# Patient Record
Sex: Female | Born: 1990 | Race: Black or African American | Hispanic: No | Marital: Single | Smoking: Never smoker
Health system: Southern US, Community
[De-identification: ages and names within clinical notes are randomized; demographics above are authoritative.]

## PROBLEM LIST (undated history)

## (undated) ENCOUNTER — Ambulatory Visit: Payer: Self-pay

## (undated) DIAGNOSIS — J36 Peritonsillar abscess: Secondary | ICD-10-CM

---
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
## 2011-01-16 ENCOUNTER — Encounter: Payer: Self-pay | Admitting: Physical Medicine and Rehabilitation

## 2011-01-16 ENCOUNTER — Emergency Department (HOSPITAL_COMMUNITY)
Admission: EM | Admit: 2011-01-16 | Discharge: 2011-01-16 | Disposition: A | Discharge status: Home / Self Care | Payer: 59 | Attending: Emergency Medicine | Admitting: Emergency Medicine

## 2011-01-16 DIAGNOSIS — J020 Streptococcal pharyngitis: Secondary | ICD-10-CM | POA: Insufficient documentation

## 2011-01-16 MED ORDER — PREDNISONE 20 MG PO TABS
10.0000 mg | ORAL_TABLET | Freq: Once | ORAL | Status: AC
  Administered 2011-01-16: 10 mg via ORAL
  Filled 2011-01-16: qty 1

## 2011-01-16 MED ORDER — GI COCKTAIL ~~LOC~~
30.0000 mL | Freq: Once | ORAL | Status: AC
  Administered 2011-01-16: 30 mL via ORAL
  Filled 2011-01-16: qty 30

## 2011-01-16 MED ORDER — PENICILLIN V POTASSIUM 500 MG PO TABS: 500.0000 mg | ORAL_TABLET | Freq: Three times a day (TID) | ORAL | Status: AC

## 2011-01-16 NOTE — ED Provider Notes (Signed)
History     CSN: [id number] Arrival date & time: 01/16/2011  7:54 PM   First MD Initiated Contact with Patient 01/16/11 2317      No chief complaint on file.   HPI  History provided by the patient. Patient presents with complaints of increasing sore throat for the past one to 2 days. Pain symptoms are similar to previous strep throat infection. Patient states she was treated for strep throat one month ago by [facility]. She took antibiotics for 10 days and had improvement of symptoms and felt well the rest of the month. sHe does not know of any sick contacts. Patient denies cough, fever, chills, sweats, difficulty swallowing, difficulty breathing, nausea or vomiting. Patient has no other significant past medical history.   History reviewed. No pertinent past medical history.  History reviewed. No pertinent past surgical history.  History reviewed. No pertinent family history.  History  Substance Use Topics  . Smoking status: Never Smoker   . Smokeless tobacco: Not on file  . Alcohol Use: No    OB History    Grav Para Term Preterm Abortions TAB SAB Ect Mult Living                  Review of Systems  Constitutional: Negative for fever and diaphoresis.  HENT: Positive for sore throat. Negative for congestion and rhinorrhea.   Respiratory: Negative for cough and shortness of breath.   Cardiovascular: Negative for chest pain.  All other systems reviewed and are negative.    Allergies  Review of patient's allergies indicates no known allergies.  Home Medications  No current outpatient prescriptions on file.  BP 114/75  Pulse 104  Temp(Src) 98.7 F (37.1 C) (Oral)  Resp 18  SpO2 98%  Physical Exam  Nursing note and vitals reviewed. Constitutional: She is oriented to person, place, and time. She appears well-developed and well-nourished. No distress.  HENT:  Head: Normocephalic and atraumatic.  Right Ear: External ear normal.  Left  Ear: External ear normal.       Tonsils enlarged with erythema bilaterally and Prelone exudate. Uvula midline. No signs for PTA.  Neck: Normal range of motion. Neck supple. No tracheal deviation present.  Cardiovascular: Normal rate and normal heart sounds.   Pulmonary/Chest: Effort normal and breath sounds normal. She has no wheezes. She has no rales.  Abdominal: Soft.  Lymphadenopathy:    She has cervical adenopathy.  Neurological: She is alert and oriented to person, place, and time. No cranial nerve deficit.  Skin: Skin is warm. No rash noted.  Psychiatric: She has a normal mood and affect. Her behavior is normal.    ED Course  Procedures (including critical care time)  Labs Reviewed  RAPID STREP SCREEN - Abnormal; Notable for the following:    Streptococcus, Group A Screen (Direct) POSITIVE (*)    All other components within normal limits   No results found.   1. Strep throat       MDM  11:20 PM patient seen and evaluated. Patient in no acute distress.        Angus Seller, [place] 01/17/11 [phone]

## 2011-01-16 NOTE — ED Notes (Signed)
Pt reports throat pain and swelling x 1 day.  No distress noted.  Pt speaking in complete sentences, controlling salivia.  Skin warm, dry and intact.  Neuro intact.  Updated on POC.

## 2011-01-16 NOTE — ED Notes (Signed)
Pt presents to department for evaluation of throat pain. Ongoing x1 day. No relief from warm salt water rinse. Recently had strep throat x1 month ago. No fever. Pt alert and oriented x4. No signs of distress at the present.

## 2011-01-16 NOTE — ED Notes (Signed)
Pt updated on delay.  Pt reports increase in difficulty breathing.  Pt ambulatory in dept without difficulty-talking in complete sentences.  pts sats 99% on RA.

## 2011-01-18 NOTE — ED Provider Notes (Signed)
Medical screening examination/treatment/procedure(s) were performed by non-physician practitioner and as supervising physician I was immediately available for consultation/collaboration.   Lebaron Bautch, MD 01/18/11 0726 

## 2011-05-05 ENCOUNTER — Encounter (HOSPITAL_COMMUNITY): Payer: Self-pay | Admitting: *Deleted

## 2011-05-05 ENCOUNTER — Emergency Department (HOSPITAL_COMMUNITY): Payer: 59

## 2011-05-05 ENCOUNTER — Emergency Department (HOSPITAL_COMMUNITY)
Admission: EM | Admit: 2011-05-05 | Discharge: 2011-05-06 | Disposition: A | Discharge status: Home / Self Care | Payer: 59 | Attending: Emergency Medicine | Admitting: Emergency Medicine

## 2011-05-05 DIAGNOSIS — R220 Localized swelling, mass and lump, head: Secondary | ICD-10-CM | POA: Insufficient documentation

## 2011-05-05 DIAGNOSIS — J36 Peritonsillar abscess: Secondary | ICD-10-CM | POA: Insufficient documentation

## 2011-05-05 DIAGNOSIS — M542 Cervicalgia: Secondary | ICD-10-CM | POA: Insufficient documentation

## 2011-05-05 MED ORDER — OXYCODONE-ACETAMINOPHEN 5-325 MG PO TABS: 1.0000 | ORAL_TABLET | Freq: Four times a day (QID) | ORAL | Status: DC | PRN

## 2011-05-05 MED ORDER — IOHEXOL 300 MG/ML  SOLN
80.0000 mL | Freq: Once | INTRAMUSCULAR | Status: AC | PRN
  Administered 2011-05-05: 80 mL via INTRAVENOUS

## 2011-05-05 MED ORDER — OXYCODONE-ACETAMINOPHEN 5-325 MG PO TABS
1.0000 | ORAL_TABLET | Freq: Once | ORAL | Status: AC
  Administered 2011-05-05: 1 via ORAL
  Filled 2011-05-05: qty 1

## 2011-05-05 MED ORDER — CLINDAMYCIN HCL 300 MG PO CAPS: 300.0000 mg | ORAL_CAPSULE | Freq: Four times a day (QID) | ORAL | Status: DC

## 2011-05-05 MED ORDER — DEXAMETHASONE SODIUM PHOSPHATE 10 MG/ML IJ SOLN
8.0000 mg | Freq: Once | INTRAMUSCULAR | Status: AC
  Administered 2011-05-05: 8 mg via INTRAVENOUS
  Filled 2011-05-05: qty 1

## 2011-05-05 NOTE — Discharge Instructions (Signed)
If you were found to have a peritonsillar abscess. It is very important that you have close followup with ear nose and throat specialist in the next 24-48 hours. Please call Dr. Thurmon Fair office tomorrow to schedule your appointment. If you have any increased swelling, difficulty swallowing or breathing, fever, chills, sweats return to emergency room sooner.   Peritonsillar Abscess A peritonsillar abscess is a collection of pus located in the back of the throat behind the tonsils. It usually occurs when a streptococcal infection of the throat or tonsils spreads into the space around the tonsils. They are almost always caused by the streptococcal germ (bacteria). The treatment of a peritonsillar abscess is most often drainage accomplished by putting a needle into the abscess or cutting (incising) and draining the abscess. This is most often followed with a course of antibiotics. HOME CARE INSTRUCTIONS  If your abscess was drained by your caregiver today, rinse your throat (gargle) with warm salt water four times per day or as needed for comfort. Do not swallow this mixture. Mix 1 teaspoon of salt in 8 ounces of warm water for gargling.   Rest in bed as needed. Resume activities as able.   Apply cold to your neck for pain relief. Fill a plastic bag with ice and wrap it in a towel. Hold the ice on your neck for 20 minutes 4 times per day.   Eat a soft or liquid diet as tolerated while your throat remains sore. Popsicles and ice cream may be good early choices. Drinking plenty of cold fluids will probably be soothing and help take swelling down in between the warm gargles.   Only take over-the-counter or prescription medicines for pain, discomfort, or fever as directed by your caregiver. Do not use aspirin unless directed by your physician. Aspirin slows down the clotting process. It can also cause bleeding from the drainage area if this was needled or incised today.   If antibiotics were prescribed,  take them as directed for the full course of the prescription. Even if you feel you are well, you need to take them.  SEEK MEDICAL CARE IF:   You have increased pain, swelling, redness, or drainage in your throat.   You develop signs of infection such as dizziness, headache, lethargy, or generalized feelings of illness.   You have difficulty breathing, swallowing or eating.   You show signs of becoming dehydrated (lightheadedness when standing, decreased urine output, a fast heart rate, or dry mouth and mucous membranes).  SEEK IMMEDIATE MEDICAL CARE IF:   You have a fever.   You are coughing up or vomiting blood.   You develop more severe throat pain uncontrolled with medicines or you start to drool.   You develop difficulty breathing, talking, or find it easier to breathe while leaning forward.  Document Released: 01/26/2005 Document Revised: 01/15/2011 Document Reviewed: 09/09/2007 [facility] Patient Information 2012 [place], [place].   RESOURCE GUIDE  Dental Problems  Patients with Medicaid: [facility] [id number]                                           [phone] [address] Phone:  [phone]  Phone:  [phone]  If unable to pay or uninsured, contact:  [facility] or [facility]. to become qualified for the adult dental clinic.  Chronic Pain Problems Contact [facility]  [phone] Patients need to be referred by their primary care doctor.  Insufficient Money for Medicine Contact [employer]:  call "211" or [facility] [phone].  No Primary Care Doctor Call [facility]  [phone] Other agencies that provide inexpensive medical care    [facility]  [phone]    [facility]  [phone]    [facility]  [phone]    [facility]  [phone]    [facility]  [phone]     [facility]  [phone]  Psychological Services [facility]  [phone] [facility] Services  [phone] [facility]   [phone] (emergency services [phone])  Substance Abuse Resources [facility]  3190355586 [facility] [phone] [facility] 623-180-4072 Floydene Flock [phone] Residential & Outpatient Substance Abuse Program  2288520213  Abuse/Neglect [facility] Child Abuse Hotline [phone] [facility] Child Abuse Hotline 214-714-7208 (After Hours)  Emergency Shelter [facility] [phone]  Maternity Homes [facility] at the [place] [facility] 802-867-1814 [facility] 5063187578  MRSA Hotline #:   3374917582    [facility]  [facility] [place]     [employer]                          [facility] Dept. [address] [place]                       [address]      [address] [place] [address]  Blondell Reveal Phone:  [phone]                                   Phone:  [phone]                 Phone:  [phone]  [facility] Phone:  [phone]  [facility] Child Abuse Hotline [phone] [phone] (After Hours)

## 2011-05-05 NOTE — ED Provider Notes (Signed)
History     CSN: [id number]  Arrival date & time 05/05/11  1830   First MD Initiated Contact with Patient 05/05/11 2038      Chief Complaint  Patient presents with  . Sore Throat     HPI  History provided by the patient. Patient is a 21 year old female with history of left PTA last December who presents with complaints of 2 days of left throat and neck pains. Patient states symptoms are similar to her previous PTA. Patient reports having an I&D performed in [place] by ENT specialist. She was prescribed kanamycin in December but only took 2 days of this medicine. Her symptoms returned 2 days ago with pain and soreness to left throat. Patient took one dose of clindamycin today and one oxycodone that she had left from previous prescriptions. Pain is described as a severe soreness there is increased with movements of swallowing. Patient denies any other aggravating or alleviating factors. She denies any associated fever, chills, sweats, nausea or vomiting.      History reviewed. No pertinent past medical history.  History reviewed. No pertinent past surgical history.  No family history on file.  History  Substance Use Topics  . Smoking status: Never Smoker   . Smokeless tobacco: Not on file  . Alcohol Use: No    OB History    Grav Para Term Preterm Abortions TAB SAB Ect Mult Living                  Review of Systems  Constitutional: Negative for fever, chills and appetite change.  HENT: Positive for sore throat. Negative for congestion and rhinorrhea.   Respiratory: Negative for cough.   Gastrointestinal: Negative for nausea and vomiting.  Skin: Negative for rash.    Allergies  Review of patient's allergies indicates no known allergies.  Home Medications   Current Outpatient Rx  Name Route Sig Dispense Refill  . CLINDAMYCIN HCL 300 MG PO CAPS Oral Take 300 mg by mouth 4 (four) times daily.    . OXYCODONE-ACETAMINOPHEN 5-325 MG PO TABS Oral Take 1 tablet by mouth  every 4 (four) hours as needed. For pain.      BP 128/72  Pulse 92  Temp(Src) 99.1 F (37.3 C) (Oral)  Resp 16  Ht 5\' 3"  (1.6 m)  Wt 136 lb (61.689 kg)  BMI 24.09 kg/m2  SpO2 99%  Physical Exam  Nursing note and vitals reviewed. Constitutional: She is oriented to person, place, and time. She appears well-developed and well-nourished. No distress.  HENT:  Head: Normocephalic.       Swelling of left tonsil with slight prominence of anterior pillar.  Uvula midline.  Appearance concerning for early PTA. No trismus  Neck: Normal range of motion. Neck supple.  Cardiovascular: Normal rate and regular rhythm.   Pulmonary/Chest: Effort normal and breath sounds normal. No respiratory distress. She has no wheezes.  Lymphadenopathy:    She has no cervical adenopathy.  Neurological: She is alert and oriented to person, place, and time.  Skin: Skin is warm and dry. No rash noted.  Psychiatric: She has a normal mood and affect. Her behavior is normal.    ED Course  Procedures     Ct Soft Tissue Neck W Contrast  05/05/2011  *RADIOLOGY REPORT*  Clinical Data: Left-sided neck pain and swelling; fever.  Question of abscess.  CT NECK WITH CONTRAST  Technique:  Multidetector CT imaging of the neck was performed with intravenous contrast.  Contrast:  80 mL  of Omnipaque 300 IV contrast  Comparison: None.  Findings: There is a focal 1.7 x 1.6 x 0.9 cm abscess noted within the periphery of the left palatine tonsil, with surrounding soft tissue inflammation involving the left parapharyngeal fat planes. There is associated enlargement of the left palatine tonsil, with partial effacement of the oropharynx and nasopharynx.  Associated soft tissue edema is noted extending inferiorly, medial to the left angle of the mandible, without definite evidence of involvement of the floor of the mouth at this time.  No additional abscesses are identified.  The adenoids are grossly unremarkable in appearance.  The  hypopharynx is unremarkable; the valleculae and piriform sinuses are within normal limits. Prevertebral soft tissues are normal in thickness.  The proximal trachea is unremarkable in appearance.  There is no evidence of vascular compromise.  Scattered cervical nodes remain normal in size; there is no evidence of cervical lymphadenopathy.  The parotid and submandibular glands are unremarkable in appearance.  The thyroid gland is within normal limits.  The visualized lung apices are clear.  The visualized superior mediastinum is grossly unremarkable in appearance.  No acute osseous abnormalities are seen.  The orbits are within normal limits.  The paranasal sinuses and mastoid air cells are well-aerated.  The visualized portions of the brain are normal in appearance.  IMPRESSION: Focal 1.7 x 1.6 x 0.9 cm abscess at the periphery of the left palatine tonsil.  Surrounding soft tissue inflammation involves the left parapharyngeal fat planes; soft tissue edema extends inferiorly, medial to the left angle of the mandible, without definite involvement of the floor of the mouth at this time. Associated partial effacement of the oropharynx and nasopharynx.  Original Report Authenticated By: Tonia Ghent, M.D.       1. Peritonsillar abscess       MDM  8:30PM patient seen and evaluated. Patient no acute distress.   9:00 PM patient discussed and seen by attending physician. Attending spoke with ENT on call. They will see patient in office for further evaluation.     Angus Seller, [place] 05/07/11 [phone]

## 2011-05-05 NOTE — ED Notes (Addendum)
Patient was seen in December for swelling in her tonsils.  She was given antibiotics.  Patient was then seen at her home town hospital and tx for peritonsilar abcess.  Patient states the area has returned on her left side.  She reports fever and chills.  Patient states the left side of her face/neck feels swollen

## 2011-05-07 ENCOUNTER — Encounter (HOSPITAL_COMMUNITY): Payer: Self-pay | Admitting: Emergency Medicine

## 2011-05-07 ENCOUNTER — Emergency Department (HOSPITAL_COMMUNITY)
Admission: EM | Admit: 2011-05-07 | Discharge: 2011-05-08 | Disposition: A | Discharge status: Home / Self Care | Payer: 59 | Attending: Emergency Medicine | Admitting: Emergency Medicine

## 2011-05-07 DIAGNOSIS — L270 Generalized skin eruption due to drugs and medicaments taken internally: Secondary | ICD-10-CM | POA: Insufficient documentation

## 2011-05-07 DIAGNOSIS — L298 Other pruritus: Secondary | ICD-10-CM | POA: Insufficient documentation

## 2011-05-07 DIAGNOSIS — T368X5A Adverse effect of other systemic antibiotics, initial encounter: Secondary | ICD-10-CM | POA: Insufficient documentation

## 2011-05-07 DIAGNOSIS — L2989 Other pruritus: Secondary | ICD-10-CM | POA: Insufficient documentation

## 2011-05-07 DIAGNOSIS — R070 Pain in throat: Secondary | ICD-10-CM | POA: Insufficient documentation

## 2011-05-07 DIAGNOSIS — J351 Hypertrophy of tonsils: Secondary | ICD-10-CM | POA: Insufficient documentation

## 2011-05-07 DIAGNOSIS — J36 Peritonsillar abscess: Secondary | ICD-10-CM | POA: Insufficient documentation

## 2011-05-07 DIAGNOSIS — T7840XA Allergy, unspecified, initial encounter: Secondary | ICD-10-CM

## 2011-05-07 MED ORDER — FAMOTIDINE IN NACL 20-0.9 MG/50ML-% IV SOLN
20.0000 mg | Freq: Once | INTRAVENOUS | Status: AC
  Administered 2011-05-08: 20 mg via INTRAVENOUS
  Filled 2011-05-07: qty 50

## 2011-05-07 MED ORDER — METHYLPREDNISOLONE SODIUM SUCC 125 MG IJ SOLR
125.0000 mg | Freq: Once | INTRAMUSCULAR | Status: AC
  Administered 2011-05-08: 125 mg via INTRAVENOUS
  Filled 2011-05-07: qty 2

## 2011-05-07 MED ORDER — DIPHENHYDRAMINE HCL 50 MG/ML IJ SOLN
12.5000 mg | Freq: Once | INTRAMUSCULAR | Status: AC
  Administered 2011-05-08: 12.5 mg via INTRAVENOUS
  Filled 2011-05-07: qty 1

## 2011-05-07 NOTE — ED Notes (Signed)
PT. REPORTS GENERALIZED BODY RASHES/ITCHING AFTER TAKING CLINDAMYCIN AND PERCOCET PRESCRIBED FOR THROAT ABSCESS . RESPIRATIONS UNLABORED/AIRWAY INTACT.

## 2011-05-08 MED ORDER — IBUPROFEN 600 MG PO TABS: 600.0000 mg | ORAL_TABLET | Freq: Four times a day (QID) | ORAL | Status: AC | PRN

## 2011-05-08 MED ORDER — ACETAMINOPHEN 325 MG PO TABS
ORAL_TABLET | ORAL | Status: AC
  Administered 2011-05-08: 650 mg
  Filled 2011-05-08: qty 2

## 2011-05-08 MED ORDER — PREDNISONE 50 MG PO TABS: 50.0000 mg | ORAL_TABLET | Freq: Every day | ORAL | Status: DC

## 2011-05-08 MED ORDER — ACETAMINOPHEN 325 MG PO TABS: 650.0000 mg | ORAL_TABLET | Freq: Once | ORAL | Status: DC

## 2011-05-08 MED ORDER — LEVOFLOXACIN 500 MG PO TABS: 500.0000 mg | ORAL_TABLET | Freq: Every day | ORAL | Status: AC

## 2011-05-08 MED ORDER — FAMOTIDINE 40 MG PO TABS: 20.0000 mg | ORAL_TABLET | Freq: Two times a day (BID) | ORAL | Status: DC

## 2011-05-08 NOTE — Procedures (Signed)
Preop diagnosis: Left peritonsillar abscess Postop diagnosis: Same Procedure: I&D of left peritonsillar abscess Surgeon: Dr. Christia Reading Anesthesia: Local with 2% lidocaine Complications: None Findings: Left tonsil enlarged and peritonsillar fullness mild. 2 cc of yellow pus was aspirated. Description of Procedure: After informed consent was obtained, the left oropharynx was sprayed with topical cetacaine twice.  The left peritonsillar region was then injected with local anesthetic.  An arching incision was then made with an 11 blade scalpel over the left tonsil.  No pus was encountered initially.  A hemostat was used to probe the wound.  The wound was then aspirated with a 10 cc syringe using an 18 gauge needle and 2 cc of yellow pus was aspirated.  The wound was extended to the abscess cavity using the 11 blade scalpel.  The patient tolerated the procedure well without complication.  Blood loss was mild.  She was then returned to ER care.

## 2011-05-08 NOTE — Discharge Instructions (Signed)

## 2011-05-08 NOTE — ED Provider Notes (Signed)
Medical screening examination/treatment/procedure(s) were conducted as a shared visit with non-physician practitioner(s) and myself.  I personally evaluated the patient during the encounter  Discussed with ENT on call. No immediate surgical therapy required. Will f/u in office in 24-48hr  Loren Racer, MD 05/08/11 [phone]

## 2011-05-08 NOTE — Consult Note (Signed)
Reason for Consult:Peritonsillar abscess Referring Physician: ER  Ana Solis is an 21 y.o. female.  HPI: Ana Solis underwent I&D of a left peritonsillar abscess in December.  Had recurrence of symptoms in the left throat with pain, difficulty swallowing, and swelling over the last several days.  Presented to the ER two days ago with these symptoms and a CT demonstrated a 1.7 cm left peritonsillar abscess.  Discharged on clindamycin.  Came back to the ER this evening with hives and itchy rash and thought to have a medication allergy to clindamycin.  Consult requested to go ahead and drain the abscess.  History reviewed. No pertinent past medical history.  History reviewed. No pertinent past surgical history.  No family history on file.  Social History:  reports that she has never smoked. She does not have any smokeless tobacco history on file. She reports that she does not drink alcohol or use illicit drugs.  Allergies:  Allergies  Allergen Reactions  . Clindamycin/Lincomycin Shortness Of Breath    To this or percocet. Pt took at the same time and had shortness of breath and throat swelling  . Percocet (Oxycodone-Acetaminophen) Shortness Of Breath    Medications: I have reviewed the patient's current medications.  No results found for this or any previous visit (from the past 48 hour(s)).  No results found.  Review of Systems  All other systems reviewed and are negative.   Blood pressure 107/75, pulse 73, temperature 98 F (36.7 C), temperature source Oral, resp. rate 14, last menstrual period 04/29/2011, SpO2 100.00%. Physical Exam  Constitutional: She is oriented to person, place, and time. She appears well-developed and well-nourished.  HENT:  Head: Normocephalic and atraumatic.  Right Ear: External ear normal.  Left Ear: External ear normal.  Nose: Nose normal.       Left tonsil edematous.  Peritonsillar area mildly full.  Uvula midline.  No trismus.  Eyes:  Conjunctivae and EOM are normal. Pupils are equal, round, and reactive to light.  Neck: Normal range of motion. Neck supple.  Cardiovascular: Normal rate.   Respiratory: Effort normal.  GI:       Did not examine.  Genitourinary:       Did not examine.  Musculoskeletal: Normal range of motion.  Lymphadenopathy:    She has cervical adenopathy (bilateral zone 2 node).  Neurological: She is alert and oriented to person, place, and time. No cranial nerve deficit.  Skin: Skin is warm and dry.  Psychiatric: She has a normal mood and affect. Her behavior is normal. Judgment and thought content normal.    Assessment/Plan: Left peritonsillar abscess The left peritonsillar area is mildly full and the tonsil is enlarged/edematous.  I recommended I&D be performed given the CT findings of two days ago.  Risks, benefits, and alternatives were discussed.  The ER staff plans to treat her medication allergy with oral steroids and change her antibiotic to Levaquin.  Follow-up with me in two weeks.  Andy Moye 05/08/2011, 1:47 AM

## 2011-05-08 NOTE — ED Provider Notes (Signed)
History     CSN: [id number]  Arrival date & time 05/07/11  2155   First MD Initiated Contact with Patient 05/07/11 2333      Chief Complaint  Patient presents with  . Allergic Reaction    (Consider location/radiation/quality/duration/timing/severity/associated sxs/prior treatment) Patient is a 20 y.o. female presenting with allergic reaction. The history is provided by the patient.  Allergic Reaction The primary symptoms are  rash. The primary symptoms do not include abdominal pain. The current episode started 3 to 5 hours ago. The problem has not changed since onset.This is a new problem.  The rash began today. The rash appears on the right leg, left leg, torso, left arm and right arm. The pain associated with the rash is moderate. The rash is associated with itching. Risk factors for rash include new medications.  Significant symptoms also include itching.    History reviewed. No pertinent past medical history.  History reviewed. No pertinent past surgical history.  No family history on file.  History  Substance Use Topics  . Smoking status: Never Smoker   . Smokeless tobacco: Not on file  . Alcohol Use: No    OB History    Grav Para Term Preterm Abortions TAB SAB Ect Mult Living                  Review of Systems  Constitutional: Negative.   HENT: Positive for sore throat. Negative for trouble swallowing and voice change.   Eyes: Negative.   Respiratory: Negative.   Cardiovascular: Negative.   Gastrointestinal: Negative for abdominal pain.  Genitourinary: Negative.   Musculoskeletal: Negative.   Skin: Positive for itching and rash.  Neurological: Negative.   Hematological: Negative.   Psychiatric/Behavioral: Negative.     Allergies  Clindamycin/lincomycin and Percocet  Home Medications   Current Outpatient Rx  Name Route Sig Dispense Refill  . CLINDAMYCIN HCL 300 MG PO CAPS Oral Take 300 mg by mouth 4 (four) times daily.    . OXYCODONE-ACETAMINOPHEN  5-325 MG PO TABS Oral Take 1 tablet by mouth every 4 (four) hours as needed. For pain.      BP 106/70  Pulse 86  Temp(Src) 98.2 F (36.8 C) (Oral)  Resp 18  SpO2 100%  LMP 04/29/2011  Physical Exam  Constitutional: She is oriented to person, place, and time. She appears well-developed and well-nourished. No distress.  HENT:  Head: Normocephalic and atraumatic.  Right Ear: External ear normal.  Left Ear: External ear normal.       Left tonsil swollen  Eyes: Conjunctivae are normal. Pupils are equal, round, and reactive to light.  Neck: Normal range of motion. Neck supple. No tracheal deviation present.  Cardiovascular: Normal rate and regular rhythm.   Pulmonary/Chest: Effort normal and breath sounds normal.  Abdominal: Soft. Bowel sounds are normal. There is no tenderness. There is no rebound and no guarding.  Musculoskeletal: Normal range of motion.  Lymphadenopathy:    She has no cervical adenopathy.  Neurological: She is alert and oriented to person, place, and time.  Skin: Skin is warm and dry. Rash noted.  Psychiatric: She has a normal mood and affect.    ED Course  Procedures (including critical care time)  Labs Reviewed - No data to display No results found.   No diagnosis found.    MDM  Seen by Dr. Jenne Pane of ENT in the Ed and had abscess drained.  Per Dr. Jenne Pane, safe to be on steroids for allergic reaction start 7  days of levaquin.  Follow up  Return for fevers, chills inability to swallow, change in voice or any concerns.  Take all antibiotics and follow up with ENT        Nyomi Howser K Christina Waldrop-Rasch, MD 05/08/11 [phone]

## 2011-05-08 NOTE — ED Notes (Signed)
Patient presents with rash and itching after taking her Clindamycin and Percocet.  Continues to have pain to her left side of her neck where there was an abcess.

## 2011-05-29 ENCOUNTER — Emergency Department (HOSPITAL_COMMUNITY)
Admission: EM | Admit: 2011-05-29 | Discharge: 2011-05-29 | Disposition: A | Discharge status: Home / Self Care | Payer: 59 | Attending: Emergency Medicine | Admitting: Emergency Medicine

## 2011-05-29 ENCOUNTER — Emergency Department (HOSPITAL_COMMUNITY): Payer: 59

## 2011-05-29 ENCOUNTER — Encounter (HOSPITAL_COMMUNITY): Payer: Self-pay | Admitting: Emergency Medicine

## 2011-05-29 DIAGNOSIS — J029 Acute pharyngitis, unspecified: Secondary | ICD-10-CM | POA: Insufficient documentation

## 2011-05-29 DIAGNOSIS — J36 Peritonsillar abscess: Secondary | ICD-10-CM | POA: Insufficient documentation

## 2011-05-29 DIAGNOSIS — K651 Peritoneal abscess: Secondary | ICD-10-CM

## 2011-05-29 HISTORY — DX: Peritonsillar abscess: J36

## 2011-05-29 LAB — URINALYSIS, ROUTINE W REFLEX MICROSCOPIC
Glucose, UA: NEGATIVE mg/dL
Protein, ur: NEGATIVE mg/dL

## 2011-05-29 LAB — POCT I-STAT, CHEM 8
BUN: 9 mg/dL (ref 6–23)
Creatinine, Ser: 0.8 mg/dL (ref 0.50–1.10)
Potassium: 3.9 mEq/L (ref 3.5–5.1)
Sodium: 141 mEq/L (ref 135–145)
TCO2: 27 mmol/L (ref 0–100)

## 2011-05-29 LAB — URINE MICROSCOPIC-ADD ON

## 2011-05-29 LAB — PREGNANCY, URINE: Preg Test, Ur: NEGATIVE

## 2011-05-29 MED ORDER — IOHEXOL 300 MG/ML  SOLN
70.0000 mL | Freq: Once | INTRAMUSCULAR | Status: AC | PRN
  Administered 2011-05-29: 70 mL via INTRAVENOUS

## 2011-05-29 MED ORDER — AMOXICILLIN-POT CLAVULANATE 600-42.9 MG/5ML PO SUSR: 7.5000 mL | Freq: Two times a day (BID) | ORAL | Status: DC

## 2011-05-29 MED ORDER — HYDROCODONE-ACETAMINOPHEN 7.5-500 MG/15ML PO SOLN: 15.0000 mL | Freq: Four times a day (QID) | ORAL | Status: DC | PRN

## 2011-05-29 MED ORDER — SODIUM CHLORIDE 0.9 % IV SOLN
INTRAVENOUS | Status: DC
  Administered 2011-05-29: 18:00:00 via INTRAVENOUS

## 2011-05-29 NOTE — ED Notes (Signed)
C/o sore throat since this morning.  Pt reports she has a peritonsillar abscess. History of same.

## 2011-05-29 NOTE — ED Provider Notes (Signed)
History     CSN: [id number]  Arrival date & time 05/29/11  1551   First MD Initiated Contact with Patient 05/29/11 1703      Chief Complaint  Patient presents with  . Abscess  . Sore Throat    HPI Pt was seen at 1705.  Per pt, c/o gradual onset and persistence of constant left sided sore throat since this morning.  Pt states she has a hx of same approx 2 weeks ago, dx peritonsillar abscess, seen by ENT Dr. Jenne Pane and had I&D with transient improvement in symptoms.  Denies fevers, no rash, no drooling/stridor, no neck pain.     Past Medical History  Diagnosis Date  . Peritonsillar abscess     History reviewed. No pertinent past surgical history.   History  Substance Use Topics  . Smoking status: Never Smoker   . Smokeless tobacco: Not on file  . Alcohol Use: No    Review of Systems ROS: Statement: All systems negative except as marked or noted in the HPI; Constitutional: Negative for fever and chills. ; ; Eyes: Negative for eye pain, redness and discharge. ; ; ENMT: Negative for ear pain, hoarseness, nasal congestion, sinus pressure and +sore throat. ; ; Cardiovascular: Negative for chest pain, palpitations, diaphoresis, dyspnea and peripheral edema. ; ; Respiratory: Negative for cough, wheezing and stridor. ; ; Gastrointestinal: Negative for nausea, vomiting, diarrhea, abdominal pain, blood in stool, hematemesis, jaundice and rectal bleeding. . ; ; Genitourinary: Negative for dysuria, flank pain and hematuria. ; ; Musculoskeletal: Negative for back pain and neck pain. Negative for swelling and trauma.; ; Skin: Negative for pruritus, rash, abrasions, blisters, bruising and skin lesion.; ; Neuro: Negative for headache, lightheadedness and neck stiffness. Negative for weakness, altered level of consciousness , altered mental status, extremity weakness, paresthesias, involuntary movement, seizure and syncope.     Allergies  Clindamycin/lincomycin and Percocet  Home Medications    Current Outpatient Rx  Name Route Sig Dispense Refill  . IBUPROFEN 600 MG PO TABS Oral Take 600 mg by mouth every 6 (six) hours as needed. For pain      BP 120/78  Pulse 64  Temp(Src) 98.1 F (36.7 C) (Oral)  Resp 16  SpO2 100%  LMP 05/27/2011  Physical Exam 1710: Physical examination:  Nursing notes reviewed; Vital signs and O2 SAT reviewed;  Constitutional: Well developed, Well nourished, Well hydrated, In no acute distress; Head:  Normocephalic, atraumatic; Eyes: EOMI, PERRL, No scleral icterus; ENMT: TM's clear bilat.  +post pharynx mildly erythemetous with bilat L>R tonsillar bulging.  No soft palate bulging, no intra-oral edema.  No drooling or stridor.  Speech clear.  No trismus.  Mouth normal, Mucous membranes moist; Neck: Supple, Full range of motion, No lymphadenopathy; Cardiovascular: Regular rate and rhythm, No murmur, rub, or gallop; Respiratory: Breath sounds clear & equal bilaterally, No rales, rhonchi, wheezes, or rub, Normal respiratory effort/excursion; Chest: Nontender, Movement normal; Abdomen: Soft, Nontender, Nondistended, Normal bowel sounds; Extremities: Pulses normal, No tenderness, No edema, No calf edema or asymmetry.; Neuro: AA&Ox3, Major CN grossly intact.  No gross focal motor or sensory deficits in extremities.; Skin: Color normal, Warm, Dry, no rash.     ED Course  Procedures     MDM  MDM Reviewed: nursing note, vitals and previous chart Interpretation: labs and CT scan   Results for orders placed during the hospital encounter of 05/29/11  URINALYSIS, ROUTINE W REFLEX MICROSCOPIC      Component Value Range   Color, Urine  YELLOW  YELLOW    APPearance CLEAR  CLEAR    Specific Gravity, Urine 1.026  1.005 - 1.030    pH 6.5  5.0 - 8.0    Glucose, UA NEGATIVE  NEGATIVE (mg/dL)   Hgb urine dipstick LARGE (*) NEGATIVE    Bilirubin Urine NEGATIVE  NEGATIVE    Ketones, ur NEGATIVE  NEGATIVE (mg/dL)   Protein, ur NEGATIVE  NEGATIVE (mg/dL)    Urobilinogen, UA 0.2  0.0 - 1.0 (mg/dL)   Nitrite NEGATIVE  NEGATIVE    Leukocytes, UA NEGATIVE  NEGATIVE   PREGNANCY, URINE      Component Value Range   Preg Test, Ur NEGATIVE  NEGATIVE   RAPID STREP SCREEN      Component Value Range   Streptococcus, Group A Screen (Direct) NEGATIVE  NEGATIVE   POCT I-STAT, CHEM 8      Component Value Range   Sodium 141  135 - 145 (mEq/L)   Potassium 3.9  3.5 - 5.1 (mEq/L)   Chloride 103  96 - 112 (mEq/L)   BUN 9  6 - 23 (mg/dL)   Creatinine, Ser 1.61  0.50 - 1.10 (mg/dL)   Glucose, Bld 096 (*) 70 - 99 (mg/dL)   Calcium, Ion 0.45  4.09 - 1.32 (mmol/L)   TCO2 27  0 - 100 (mmol/L)   Hemoglobin 13.6  12.0 - 15.0 (g/dL)   HCT 81.1  91.4 - 78.2 (%)  URINE MICROSCOPIC-ADD ON      Component Value Range   Squamous Epithelial / LPF FEW (*) RARE    RBC / HPF 3-6  <3 (RBC/hpf)   Urine-Other MUCOUS PRESENT     Ct Soft Tissue Neck W Contrast 05/29/2011  *RADIOLOGY REPORT*  Clinical Data: Sore throat.  Recent peritonsillar abscess by CT scan.  CT NECK WITH CONTRAST  Technique:  Multidetector CT imaging of the neck was performed with intravenous contrast.  Contrast: 70mL OMNIPAQUE IOHEXOL 300 MG/ML  SOLN  Comparison: CT neck 05/05/2011.  Findings: Again seen is a rim enhancing low attenuation collection consistent with a peritonsillar abscess on the left measuring 1.1 x 0.7 by 1.3 cm cranial-caudal cm compared to 1.6 x 0.9 by 1.7 cm cranial-caudal cm on the prior study.  Left tonsillar swelling is again seen without marked change.  There is no new abscess. Reactive lymphadenopathy on the left side of the neck is not notably changed.  The aerodigestive tract is otherwise unremarkable.  Major caliber vascular structures are patent. Imaged paranasal sinuses and mastoid air cells are clear.  Imaged intracranial contents are unremarkable.  Orbits appear normal.  IMPRESSION: Some decrease in the size of a left peritonsillar abscess as described above with persistent swelling  noted.  No new abscess or other new abnormality.  Original Report Authenticated By: Bernadene Bell. Maricela Curet, M.D.   Ct Soft Tissue Neck W Contrast 05/05/2011  *RADIOLOGY REPORT*  Clinical Data: Left-sided neck pain and swelling; fever.  Question of abscess.  CT NECK WITH CONTRAST  Technique:  Multidetector CT imaging of the neck was performed with intravenous contrast.  Contrast:  80 mL of Omnipaque 300 IV contrast  Comparison: None.  Findings: There is a focal 1.7 x 1.6 x 0.9 cm abscess noted within the periphery of the left palatine tonsil, with surrounding soft tissue inflammation involving the left parapharyngeal fat planes. There is associated enlargement of the left palatine tonsil, with partial effacement of the oropharynx and nasopharynx.  Associated soft tissue edema is noted extending inferiorly, medial  to the left angle of the mandible, without definite evidence of involvement of the floor of the mouth at this time.  No additional abscesses are identified.  The adenoids are grossly unremarkable in appearance.  The hypopharynx is unremarkable; the valleculae and piriform sinuses are within normal limits. Prevertebral soft tissues are normal in thickness.  The proximal trachea is unremarkable in appearance.  There is no evidence of vascular compromise.  Scattered cervical nodes remain normal in size; there is no evidence of cervical lymphadenopathy.  The parotid and submandibular glands are unremarkable in appearance.  The thyroid gland is within normal limits.  The visualized lung apices are clear.  The visualized superior mediastinum is grossly unremarkable in appearance.  No acute osseous abnormalities are seen.  The orbits are within normal limits.  The paranasal sinuses and mastoid air cells are well-aerated.  The visualized portions of the brain are normal in appearance.  IMPRESSION: Focal 1.7 x 1.6 x 0.9 cm abscess at the periphery of the left palatine tonsil.  Surrounding soft tissue inflammation  involves the left parapharyngeal fat planes; soft tissue edema extends inferiorly, medial to the left angle of the mandible, without definite involvement of the floor of the mouth at this time. Associated partial effacement of the oropharynx and nasopharynx.  Original Report Authenticated By: Tonia Ghent, M.D.      7:40 PM:   Pt has been laughing and loudly talking with several people at her bedside during her ED stay.  Afebrile, NAD, resps easy, speech clear, no drooling, no hoarse voice, no stridor.  Pt wants to go home.  Pt states she had an allergic reaction while on the clindamycin and percocet, she states she is definitely allergic to the clindamycin, unsure about the percocet.  States she has never had hydrocodone/vicodin and is willing to try the lortab elixer (does not want to swallow pills with her sore throat).  T/C to ENT Dr. Annalee Genta, case discussed, including:  HPI, pertinent PM/SHx, VS/PE, dx testing, ED course and treatment:  Appears that abscess on today's scan is smaller than on previous, first course of action would be to rx abx as often these smaller abscess respond well to abx vs I&D, pt will likely ultimately need tonsillectomy, he agrees with starting augmentin due to pt allergy to clindamycin (augmentin ES elixer 1.5 tsp PO BID x10d) and rx lortab elixer pain med; he will f/u in ofc on Monday; he is on call all weekend if pt worsens and returns to ED sooner.  Dx testing, as well as D/W ENT doctor, d/w pt and family.  Questions answered.  Verb understanding, agreeable to d/c home with outpt f/u.        Laray Anger, DO 06/01/11 0134

## 2011-05-29 NOTE — ED Notes (Signed)
Unable to obtain a urine spec at present the pt cannot  void

## 2011-05-29 NOTE — Discharge Instructions (Signed)
RESOURCE GUIDE  Dental Problems  Patients with Medicaid: [facility]                     [facility] [address]                                           [address] Phone:  [phone]                                                  Phone:  [phone]  If unable to pay or uninsured, contact:  [facility] or [facility] to become qualified for the adult dental clinic.  Chronic Pain Problems Contact [facility]  [phone] Patients need to be referred by their primary care doctor.  Insufficient Money for Medicine Contact [employer]:  call "211" or [facility] 271-5999.  No Primary Care Doctor Call [facility]  832-8000 Other agencies that provide inexpensive medical care    [facility]  [phone]    [facility]  [phone]    [facility]  [phone]    Women's Clinic  [phone]    [facility]  [phone]    [facility]  [phone]  Psychological Services [facility]  [phone] [facility]  [phone] [facility]   [phone] (emergency services 641-4993)  Substance Abuse Resources [facility]  336-882-2125 [facility] 336-784-9470 [facility] 336-285-9073 [facility] 336-845-3988 Residential & Outpatient Substance Abuse Program  800-659-3381  Abuse/Neglect [place] Child Abuse Hotline (336) 641-3795 [place] Child Abuse Hotline 800-378-5315 (After Hours)  Emergency Shelter [facility] (336) 271-5985  Maternity Homes [facility] (336) 275-9566 [facility] (704) 372-4663  MRSA Hotline #:   832-7006    [place] Resources  [facility] [place]     [employer]                          [facility] [address] [place]                       [address]      [address]  [place]                                                [place]                            [place] Phone:  [phone]                                   Phone:  [phone]                 Phone:  [phone]  [facility] Phone:  [phone]    [facility] Child Abuse Hotline (302)031-2239 (740)162-1155 (After Hours)   Take the prescriptions as directed.  Call the ENT doctor on Monday morning to schedule a follow up appointment for Monday.  Return to the Emergency Department immediately sooner if worsening.

## 2011-05-29 NOTE — ED Notes (Signed)
Urine collected and sent.  Waiting

## 2011-05-31 ENCOUNTER — Ambulatory Visit: Admit: 2011-05-31 | Payer: Self-pay | Admitting: Otolaryngology

## 2011-05-31 ENCOUNTER — Encounter (HOSPITAL_COMMUNITY): Payer: Self-pay | Admitting: Otolaryngology

## 2011-05-31 ENCOUNTER — Encounter (HOSPITAL_COMMUNITY): Payer: Self-pay | Admitting: Anesthesiology

## 2011-05-31 ENCOUNTER — Emergency Department (HOSPITAL_COMMUNITY): Payer: 59 | Admitting: Anesthesiology

## 2011-05-31 ENCOUNTER — Encounter (HOSPITAL_COMMUNITY)
Admission: EM | Disposition: A | Discharge status: Home / Self Care | Payer: Self-pay | Source: Home / Self Care | Attending: Emergency Medicine

## 2011-05-31 ENCOUNTER — Encounter (HOSPITAL_COMMUNITY): Payer: Self-pay | Admitting: *Deleted

## 2011-05-31 ENCOUNTER — Ambulatory Visit (HOSPITAL_COMMUNITY)
Admission: EM | Admit: 2011-05-31 | Discharge: 2011-06-01 | Disposition: A | Discharge status: Home / Self Care | Payer: 59 | Attending: Otolaryngology | Admitting: Otolaryngology

## 2011-05-31 DIAGNOSIS — J36 Peritonsillar abscess: Secondary | ICD-10-CM | POA: Diagnosis present

## 2011-05-31 DIAGNOSIS — J039 Acute tonsillitis, unspecified: Secondary | ICD-10-CM | POA: Insufficient documentation

## 2011-05-31 DIAGNOSIS — Z792 Long term (current) use of antibiotics: Secondary | ICD-10-CM | POA: Insufficient documentation

## 2011-05-31 HISTORY — PX: TONSILLECTOMY: SHX5217

## 2011-05-31 SURGERY — TONSILLECTOMY
Anesthesia: General | Site: Mouth | Wound class: Dirty or Infected

## 2011-05-31 MED ORDER — SODIUM CHLORIDE 0.9 % IV SOLN
INTRAVENOUS | Status: DC | PRN
  Administered 2011-05-31: 15:00:00 via INTRAVENOUS

## 2011-05-31 MED ORDER — ACETAMINOPHEN 160 MG/5ML PO SOLN: 650.0000 mg | ORAL | Status: DC | PRN

## 2011-05-31 MED ORDER — SUCCINYLCHOLINE CHLORIDE 20 MG/ML IJ SOLN
INTRAMUSCULAR | Status: DC | PRN
  Administered 2011-05-31: 120 mg via INTRAVENOUS

## 2011-05-31 MED ORDER — FENTANYL CITRATE 0.05 MG/ML IJ SOLN
25.0000 ug | INTRAMUSCULAR | Status: DC | PRN
  Administered 2011-05-31 (×2): 50 ug via INTRAVENOUS

## 2011-05-31 MED ORDER — ACETAMINOPHEN 650 MG RE SUPP: 650.0000 mg | RECTAL | Status: DC | PRN

## 2011-05-31 MED ORDER — ONDANSETRON HCL 4 MG/2ML IJ SOLN
INTRAMUSCULAR | Status: DC | PRN
  Administered 2011-05-31: 4 mg via INTRAVENOUS

## 2011-05-31 MED ORDER — DEXAMETHASONE SODIUM PHOSPHATE 10 MG/ML IJ SOLN
10.0000 mg | Freq: Two times a day (BID) | INTRAMUSCULAR | Status: AC
  Administered 2011-05-31 – 2011-06-01 (×2): 10 mg via INTRAVENOUS
  Filled 2011-05-31 (×2): qty 1

## 2011-05-31 MED ORDER — ONDANSETRON HCL 4 MG PO TABS: 4.0000 mg | ORAL_TABLET | ORAL | Status: DC | PRN

## 2011-05-31 MED ORDER — ONDANSETRON HCL 4 MG/2ML IJ SOLN: 4.0000 mg | INTRAMUSCULAR | Status: DC | PRN

## 2011-05-31 MED ORDER — LACTATED RINGERS IV SOLN
INTRAVENOUS | Status: DC | PRN
  Administered 2011-05-31 (×2): via INTRAVENOUS

## 2011-05-31 MED ORDER — DROPERIDOL 2.5 MG/ML IJ SOLN
INTRAMUSCULAR | Status: DC | PRN
  Administered 2011-05-31: 0.625 mg via INTRAVENOUS

## 2011-05-31 MED ORDER — SODIUM CHLORIDE 0.9 % IV SOLN
3.0000 g | Freq: Three times a day (TID) | INTRAVENOUS | Status: DC
  Administered 2011-06-01 (×2): 3 g via INTRAVENOUS
  Filled 2011-05-31 (×4): qty 3

## 2011-05-31 MED ORDER — MIDAZOLAM HCL 5 MG/5ML IJ SOLN
INTRAMUSCULAR | Status: DC | PRN
  Administered 2011-05-31: 2 mg via INTRAVENOUS

## 2011-05-31 MED ORDER — MORPHINE SULFATE 4 MG/ML IJ SOLN
2.0000 mg | INTRAMUSCULAR | Status: DC | PRN
  Administered 2011-05-31: 4 mg via INTRAVENOUS
  Filled 2011-05-31: qty 1

## 2011-05-31 MED ORDER — 0.9 % SODIUM CHLORIDE (POUR BTL) OPTIME
TOPICAL | Status: DC | PRN
  Administered 2011-05-31: 1000 mL

## 2011-05-31 MED ORDER — HYDROCODONE-ACETAMINOPHEN 7.5-500 MG/15ML PO SOLN
10.0000 mL | ORAL | Status: DC | PRN
  Administered 2011-05-31: 15 mL via ORAL
  Filled 2011-05-31: qty 15

## 2011-05-31 MED ORDER — SODIUM CHLORIDE 0.9 % IV SOLN: Freq: Once | INTRAVENOUS | Status: DC

## 2011-05-31 MED ORDER — METOCLOPRAMIDE HCL 5 MG/ML IJ SOLN
INTRAMUSCULAR | Status: DC | PRN
  Administered 2011-05-31: 10 mg via INTRAVENOUS

## 2011-05-31 MED ORDER — KCL-LACTATED RINGERS-D5W 20 MEQ/L IV SOLN
INTRAVENOUS | Status: DC
  Administered 2011-05-31 – 2011-06-01 (×2): via INTRAVENOUS
  Filled 2011-05-31 (×3): qty 1000

## 2011-05-31 MED ORDER — DEXAMETHASONE SODIUM PHOSPHATE 4 MG/ML IJ SOLN
INTRAMUSCULAR | Status: DC | PRN
  Administered 2011-05-31: 10 mg via INTRAVENOUS

## 2011-05-31 MED ORDER — PHENOL 1.4 % MT LIQD
1.0000 | OROMUCOSAL | Status: DC | PRN
  Filled 2011-05-31: qty 177

## 2011-05-31 MED ORDER — SODIUM CHLORIDE 0.9 % IV SOLN
3.0000 g | INTRAVENOUS | Status: AC
  Administered 2011-05-31: 3 g via INTRAVENOUS
  Filled 2011-05-31: qty 3

## 2011-05-31 MED ORDER — FENTANYL CITRATE 0.05 MG/ML IJ SOLN
INTRAMUSCULAR | Status: DC | PRN
  Administered 2011-05-31 (×3): 100 ug via INTRAVENOUS

## 2011-05-31 MED ORDER — MORPHINE SULFATE 4 MG/ML IJ SOLN: 4.0000 mg | Freq: Once | INTRAMUSCULAR | Status: DC

## 2011-05-31 MED ORDER — PROPOFOL 10 MG/ML IV EMUL
INTRAVENOUS | Status: DC | PRN
  Administered 2011-05-31: 180 mg via INTRAVENOUS

## 2011-05-31 SURGICAL SUPPLY — 24 items
CATH ROBINSON RED A/P 12FR (CATHETERS) IMPLANT
CLEANER TIP ELECTROSURG 2X2 (MISCELLANEOUS) ×2 IMPLANT
CLOTH BEACON ORANGE TIMEOUT ST (SAFETY) ×2 IMPLANT
COAGULATOR SUCT SWTCH 10FR 6 (ELECTROSURGICAL) ×2 IMPLANT
ELECT COATED BLADE 2.86 ST (ELECTRODE) ×2 IMPLANT
ELECT REM PT RETURN 9FT ADLT (ELECTROSURGICAL)
ELECT REM PT RETURN 9FT PED (ELECTROSURGICAL)
ELECTRODE REM PT RETRN 9FT PED (ELECTROSURGICAL) IMPLANT
ELECTRODE REM PT RTRN 9FT ADLT (ELECTROSURGICAL) IMPLANT
GAUZE SPONGE 4X4 16PLY XRAY LF (GAUZE/BANDAGES/DRESSINGS) ×2 IMPLANT
GLOVE BIOGEL M 7.0 STRL (GLOVE) ×4 IMPLANT
GOWN STRL NON-REIN LRG LVL3 (GOWN DISPOSABLE) ×4 IMPLANT
KIT BASIN OR (CUSTOM PROCEDURE TRAY) ×2 IMPLANT
KIT ROOM TURNOVER OR (KITS) ×2 IMPLANT
NS IRRIG 1000ML POUR BTL (IV SOLUTION) ×2 IMPLANT
PACK SURGICAL SETUP 50X90 (CUSTOM PROCEDURE TRAY) ×2 IMPLANT
PAD ARMBOARD 7.5X6 YLW CONV (MISCELLANEOUS) ×4 IMPLANT
PENCIL BUTTON HOLSTER BLD 10FT (ELECTRODE) ×2 IMPLANT
SPECIMEN JAR SMALL (MISCELLANEOUS) ×4 IMPLANT
SPONGE TONSIL 1 RF SGL (DISPOSABLE) ×2 IMPLANT
SYR BULB 3OZ (MISCELLANEOUS) ×2 IMPLANT
TOWEL OR 17X24 6PK STRL BLUE (TOWEL DISPOSABLE) ×4 IMPLANT
TUBE SALEM SUMP 16 FR W/ARV (TUBING) ×2 IMPLANT
WATER STERILE IRR 1000ML POUR (IV SOLUTION) ×2 IMPLANT

## 2011-05-31 NOTE — Progress Notes (Signed)
05/31/11 1431  OTHER  CSW Follow Up Status No follow-up needed (Consult unit based LCSW if needs arise)

## 2011-05-31 NOTE — Op Note (Signed)
Ana Solis, Ana Solis            ACCOUNT NO.:  [id number]  MEDICAL RECORD NO.:  [id number]  LOCATION:  5122                         FACILITY:  [facility]  PHYSICIAN:  Kinnie Scales. Annalee Genta, M.D.DATE OF BIRTH:  December 28, 1990  DATE OF PROCEDURE:  05/31/2011 DATE OF DISCHARGE:                              OPERATIVE REPORT   PREOPERATIVE DIAGNOSES: 1. Recurrent acute tonsillitis. 2. Recurrent left peritonsillar abscess.  POSTOPERATIVE DIAGNOSES: 1. Recurrent acute tonsillitis. 2. Recurrent left peritonsillar abscess.  INDICATION FOR SURGERY: 1. Recurrent acute tonsillitis. 2. Recurrent left peritonsillar abscess.  SURGICAL PROCEDURE:  Tonsillectomy.  ANESTHESIA:  General endotracheal.  COMPLICATIONS:  None.  ESTIMATED BLOOD LOSS:  Minimal.  The patient transferred from the operating room to the recovery room in stable condition.  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  She has had multiple episodes of tonsillitis requiring antibiotic therapy and over the last 4-6 weeks, she has had increasingly worse symptoms.  She underwent incision and drainage of the left peritonsillar abscess by Dr. Jenne Pane and appropriate antibiotic therapy with resolution of acute symptoms.  Over the last 3 days, she has had increasing symptoms of left-sided throat pain, left otalgia, and trismus consistent with recurrent peritonsillar abscess. Examination showed clinical findings consistent with recurrent abscess and given her history, physical findings, and recurrent nature of her problem, I have recommended that we undertake tonsillectomy.  The risks and benefits of the procedure were discussed in detail.  The patient understood and concurred with our plan for surgery, which is scheduled on an urgent basis at [facility] Main OR.  PROCEDURE:  The patient was brought to the operating room on May 31, 2011 and placed in supine position  on the operating table.  General endotracheal anesthesia was established without difficulty.  When the patient was adequately anesthetized, she was positioned on the operating table and prepped and draped in a sterile fashion.  A Crowe-Davis mouth gag was inserted without difficulty.  Oral cavity and oropharynx were examined.  There were no loose or broken teeth, and the hard and soft palate were intact.  The patient had significant swelling in the left peritonsillar fossa with medialization of the left tonsil consistent with an ongoing peritonsillar abscess.  Surgical procedure was begun with tonsillectomy on the right-hand side.  Using Bovie electrocautery and dissecting in subcapsular fashion, the entire right tonsil was removed from the superior pole to tongue base.  The tonsil was sent to pathology for gross microscopic evaluation.  Attention was then turned to the left-hand side.  Dissection was carried out with Bovie electrocautery.  After incising the mucosa underlying muscular layer, a significant amount of purulent material was aspirated from the left tonsillar fossa.  Dissection was then carried out using Bovie electrocautery, removing the entire left tonsil from superior pole including the abscess cavity and the inferior aspect of the left tonsil. The left tonsil tissue was sent to pathology as well.  The tonsillar fossae were then gently abraded with a dry sponge and cauterized with suction cautery for several areas of point hemorrhage.  The oropharynx and oral cavity were then thoroughly irrigated and suctioned.  An orogastric tube  was passed.  Stomach contents were aspirated.  CroweEarlene Plater mouth gag was released and reapplied.  There was no active bleeding.  Mouth gag was removed.  No loose or broken teeth.  The patient was then awakened from anesthetic.  She was extubated and transferred from the operating room to the recovery room in stable condition.  No complications  and minimal blood loss.          ______________________________ Kinnie Scales Annalee Genta, M.D.     DLS/MEDQ  D:  84/13/2440  T:  05/31/2011  Job:  [id number]

## 2011-05-31 NOTE — Progress Notes (Signed)
05/31/11 1430  Discharge Planning  Type of Residence Private residence  Living Arrangements Other relatives  Home Care Services No  Support Systems Other relatives  Do you have any problems obtaining your medications? No  Once you are discharged, how will you get to your follow-up appointment? Self;Family  Expected Discharge Date 06/02/11  Case Management Consult Needed No  Social Work Consult Needed No

## 2011-05-31 NOTE — Anesthesia Procedure Notes (Signed)
Procedure Name: Intubation Date/Time: 05/31/2011 2:57 PM Performed by: Wray Kearns A Pre-anesthesia Checklist: Patient identified, Emergency Drugs available, Suction available, Patient being monitored and Timeout performed Patient Re-evaluated:Patient Re-evaluated prior to inductionOxygen Delivery Method: Circle system utilized Preoxygenation: Pre-oxygenation with 100% oxygen Intubation Type: IV induction, Rapid sequence and Cricoid Pressure applied Ventilation: Mask ventilation without difficulty Grade View: Grade I Tube type: Oral Tube size: 7.5 mm Number of attempts: 1 Airway Equipment and Method: Stylet and Video-laryngoscopy Placement Confirmation: ETT inserted through vocal cords under direct vision,  positive ETCO2,  CO2 detector and breath sounds checked- equal and bilateral Secured at: 22 cm Tube secured with: Tape Dental Injury: Teeth and Oropharynx as per pre-operative assessment

## 2011-05-31 NOTE — Anesthesia Postprocedure Evaluation (Signed)
  Anesthesia Post-op Note  Patient: Ana Solis  Procedure(s) Performed: Procedure(s) (LRB): TONSILLECTOMY (N/A)  Patient Location: PACU  Anesthesia Type: General  Level of Consciousness: awake  Airway and Oxygen Therapy: Patient Spontanous Breathing  Post-op Pain: mild  Post-op Assessment: Post-op Vital signs reviewed  Post-op Vital Signs: Reviewed  Complications: No apparent anesthesia complications

## 2011-05-31 NOTE — Anesthesia Preprocedure Evaluation (Addendum)
Anesthesia Evaluation  Patient identified by MRN, date of birth, ID band Patient awake    Reviewed: Allergy & Precautions, H&P , NPO status , Patient's Chart, lab work & pertinent test results  Airway Mallampati: I TM Distance: <3 FB     Dental  (+) Teeth Intact   Pulmonary neg pulmonary ROS,  breath sounds clear to auscultation        Cardiovascular negative cardio ROS  Rhythm:Regular Rate:Normal     Neuro/Psych negative neurological ROS     GI/Hepatic negative GI ROS, Neg liver ROS,   Endo/Other  negative endocrine ROS  Renal/GU negative Renal ROS  negative genitourinary   Musculoskeletal negative musculoskeletal ROS (+)   Abdominal (+)  Abdomen: soft. Bowel sounds: normal.  Peds negative pediatric ROS (+)  Hematology negative hematology ROS (+)   Anesthesia Other Findings Neg pregnancy test, and pt. On menstrual cycle  Reproductive/Obstetrics negative OB ROS                        Anesthesia Physical Anesthesia Plan  ASA: I and Emergent  Anesthesia Plan: General   Post-op Pain Management:    Induction: Intravenous, Rapid sequence and Cricoid pressure planned  Airway Management Planned: Oral ETT  Additional Equipment:   Intra-op Plan:   Post-operative Plan: Possible Post-op intubation/ventilation  Informed Consent: I have reviewed the patients History and Physical, chart, labs and discussed the procedure including the risks, benefits and alternatives for the proposed anesthesia with the patient or authorized representative who has indicated his/her understanding and acceptance.   Dental advisory given  Plan Discussed with: CRNA  Anesthesia Plan Comments:         Anesthesia Quick Evaluation

## 2011-05-31 NOTE — H&P (Signed)
Ana Solis is an 21 y.o. female.   Chief Complaint: Left PTA HPI: Hx of recurrent tonsillitis. 3 wks of severe ST, Left PTA - I&D of PTA by Dr. Jenne Pane 3 wks ago. Now with worsening Left ST and neck pain.  Past Medical History  Diagnosis Date  . Peritonsillar abscess     History reviewed. No pertinent past surgical history.  History reviewed. No pertinent family history. Social History:  reports that she has never smoked. She does not have any smokeless tobacco history on file. She reports that she does not drink alcohol or use illicit drugs.  Allergies:  Allergies  Allergen Reactions  . Clindamycin/Lincomycin Shortness Of Breath    To this or percocet. Pt took at the same time and had shortness of breath and throat swelling  . Percocet (Oxycodone-Acetaminophen) Rash    Medications Prior to Admission  Medication Dose Route Frequency Provider Last Rate Last Dose  . 0.9 %  sodium chloride infusion   Intravenous Once Geoffery Lyons, MD      . lactated ringers infusion    Continuous PRN Kristopher Key, CRNA      . morphine 4 MG/ML injection 4 mg  4 mg Intravenous Once Geoffery Lyons, MD       No current outpatient prescriptions on file as of 05/31/2011.    Results for orders placed during the hospital encounter of 05/29/11 (from the past 48 hour(s))  RAPID STREP SCREEN     Status: Normal   Collection Time   05/29/11  5:25 PM      Component Value Range Comment   Streptococcus, Group A Screen (Direct) NEGATIVE  NEGATIVE    POCT I-STAT, CHEM 8     Status: Abnormal   Collection Time   05/29/11  5:34 PM      Component Value Range Comment   Sodium 141  135 - 145 (mEq/L)    Potassium 3.9  3.5 - 5.1 (mEq/L)    Chloride 103  96 - 112 (mEq/L)    BUN 9  6 - 23 (mg/dL)    Creatinine, Ser 1.61  0.50 - 1.10 (mg/dL)    Glucose, Bld 096 (*) 70 - 99 (mg/dL)    Calcium, Ion 0.45  1.12 - 1.32 (mmol/L)    TCO2 27  0 - 100 (mmol/L)    Hemoglobin 13.6  12.0 - 15.0 (g/dL)    HCT 40.9  81.1 - 91.4  (%)   URINALYSIS, ROUTINE W REFLEX MICROSCOPIC     Status: Abnormal   Collection Time   05/29/11  6:55 PM      Component Value Range Comment   Color, Urine YELLOW  YELLOW     APPearance CLEAR  CLEAR     Specific Gravity, Urine 1.026  1.005 - 1.030     pH 6.5  5.0 - 8.0     Glucose, UA NEGATIVE  NEGATIVE (mg/dL)    Hgb urine dipstick LARGE (*) NEGATIVE     Bilirubin Urine NEGATIVE  NEGATIVE     Ketones, ur NEGATIVE  NEGATIVE (mg/dL)    Protein, ur NEGATIVE  NEGATIVE (mg/dL)    Urobilinogen, UA 0.2  0.0 - 1.0 (mg/dL)    Nitrite NEGATIVE  NEGATIVE     Leukocytes, UA NEGATIVE  NEGATIVE    PREGNANCY, URINE     Status: Normal   Collection Time   05/29/11  6:55 PM      Component Value Range Comment   Preg Test, Ur NEGATIVE  NEGATIVE  URINE MICROSCOPIC-ADD ON     Status: Abnormal   Collection Time   05/29/11  6:55 PM      Component Value Range Comment   Squamous Epithelial / LPF FEW (*) RARE     RBC / HPF 3-6  <3 (RBC/hpf)    Urine-Other MUCOUS PRESENT      Ct Soft Tissue Neck W Contrast  05/29/2011  *RADIOLOGY REPORT*  Clinical Data: Sore throat.  Recent peritonsillar abscess by CT scan.  CT NECK WITH CONTRAST  Technique:  Multidetector CT imaging of the neck was performed with intravenous contrast.  Contrast: 70mL OMNIPAQUE IOHEXOL 300 MG/ML  SOLN  Comparison: CT neck 05/05/2011.  Findings: Again seen is a rim enhancing low attenuation collection consistent with a peritonsillar abscess on the left measuring 1.1 x 0.7 by 1.3 cm cranial-caudal cm compared to 1.6 x 0.9 by 1.7 cm cranial-caudal cm on the prior study.  Left tonsillar swelling is again seen without marked change.  There is no new abscess. Reactive lymphadenopathy on the left side of the neck is not notably changed.  The aerodigestive tract is otherwise unremarkable.  Major caliber vascular structures are patent. Imaged paranasal sinuses and mastoid air cells are clear.  Imaged intracranial contents are unremarkable.  Orbits appear  normal.  IMPRESSION: Some decrease in the size of a left peritonsillar abscess as described above with persistent swelling noted.  No new abscess or other new abnormality.  Original Report Authenticated By: Bernadene Bell. Maricela Curet, M.D.    Review of Systems  Constitutional: Positive for fever, chills and weight loss.  HENT: Positive for sore throat.   Respiratory: Negative.   Cardiovascular: Negative.   Musculoskeletal: Negative.   Skin: Negative.   Neurological: Negative.     Blood pressure 142/76, pulse 120, temperature 100.9 F (38.3 C), temperature source Oral, resp. rate 18, last menstrual period 05/27/2011, SpO2 99.00%. Physical Exam  Constitutional: She is oriented to person, place, and time. She appears well-developed and well-nourished.  HENT:  Head: Normocephalic and atraumatic.  Mouth/Throat: Mucous membranes are normal. Oropharyngeal exudate and tonsillar abscesses present.  Neck: Normal range of motion. Neck supple.  Cardiovascular: Normal rate and regular rhythm.   Respiratory: Effort normal and breath sounds normal.  GI: Soft.  Musculoskeletal: Normal range of motion.  Neurological: She is alert and oriented to person, place, and time.     Assessment/Plan Adm to [facility] for tonsillectomy, Plan adm for O/N obs.  Ana Solis 05/31/2011, 2:39 PM

## 2011-05-31 NOTE — Progress Notes (Signed)
Pt admitted into 5122 from stretcher from PACU s/p tonsillectomy by Dr. Annalee Genta.  Pt alert, oriented to room and unit.  Landin Tallon Alexanders, boyfriend at bedside.  Ice collar to site and explained to pt.  HOB up 45 degrees.

## 2011-05-31 NOTE — ED Provider Notes (Signed)
History     CSN: [id number]  Arrival date & time 05/31/11  1236   First MD Initiated Contact with Patient 05/31/11 1257      Chief Complaint  Patient presents with  . Sore Throat    (Consider location/radiation/quality/duration/timing/severity/associated sxs/prior treatment) HPI Comments: Has history of recurrent peritonsillar abscess.  Has been I and Ded by Dr. Jenne Pane in the past.  Had CT scan 2 days ago showing abscess smaller in size.  Her throat is now more painful and swollen and she cannot speak.    Patient is a 21 y.o. female presenting with pharyngitis. The history is provided by the patient.  Sore Throat This is a recurrent problem. The problem occurs constantly. The problem has been rapidly worsening. The symptoms are aggravated by swallowing. The symptoms are relieved by nothing. Treatments tried: antibiotics. The treatment provided mild relief.    Past Medical History  Diagnosis Date  . Peritonsillar abscess     History reviewed. No pertinent past surgical history.  History reviewed. No pertinent family history.  History  Substance Use Topics  . Smoking status: Never Smoker   . Smokeless tobacco: Not on file  . Alcohol Use: No    OB History    Grav Para Term Preterm Abortions TAB SAB Ect Mult Living                  Review of Systems  All other systems reviewed and are negative.    Allergies  Clindamycin/lincomycin and Percocet  Home Medications   Current Outpatient Rx  Name Route Sig Dispense Refill  . AMOXICILLIN-POT CLAVULANATE 600-42.9 MG/5ML PO SUSR Oral Take 7.5 mLs by mouth 2 (two) times daily. Take for 10 days. First dose 05/29/2011.    Marland Kitchen HYDROCODONE-ACETAMINOPHEN 7.5-500 MG/15ML PO SOLN Oral Take 15 mLs by mouth every 6 (six) hours as needed. For pain.      BP 142/76  Pulse 120  Temp(Src) 100.9 F (38.3 C) (Oral)  Resp 18  SpO2 99%  LMP 05/27/2011  Physical Exam  Nursing note and vitals reviewed. Constitutional: She is oriented  to person, place, and time. She appears well-developed and well-nourished. No distress.  HENT:  Head: Normocephalic and atraumatic.       The left tonsil is swollen and erythematous.  There is assymmetry of the PO.    Neck: Normal range of motion. Neck supple.  Cardiovascular: Normal rate and regular rhythm.   Pulmonary/Chest: Effort normal.       No stridor  Neurological: She is alert and oriented to person, place, and time.  Skin: Skin is warm and dry. She is not diaphoretic.    ED Course  Procedures (including critical care time)  Labs Reviewed - No data to display Ct Soft Tissue Neck W Contrast  05/29/2011  *RADIOLOGY REPORT*  Clinical Data: Sore throat.  Recent peritonsillar abscess by CT scan.  CT NECK WITH CONTRAST  Technique:  Multidetector CT imaging of the neck was performed with intravenous contrast.  Contrast: 70mL OMNIPAQUE IOHEXOL 300 MG/ML  SOLN  Comparison: CT neck 05/05/2011.  Findings: Again seen is a rim enhancing low attenuation collection consistent with a peritonsillar abscess on the left measuring 1.1 x 0.7 by 1.3 cm cranial-caudal cm compared to 1.6 x 0.9 by 1.7 cm cranial-caudal cm on the prior study.  Left tonsillar swelling is again seen without marked change.  There is no new abscess. Reactive lymphadenopathy on the left side of the neck is not notably changed.  The aerodigestive tract is otherwise unremarkable.  Major caliber vascular structures are patent. Imaged paranasal sinuses and mastoid air cells are clear.  Imaged intracranial contents are unremarkable.  Orbits appear normal.  IMPRESSION: Some decrease in the size of a left peritonsillar abscess as described above with persistent swelling noted.  No new abscess or other new abnormality.  Original Report Authenticated By: Bernadene Bell. Maricela Curet, M.D.     No diagnosis found.    MDM  I spoke with Dr. Annalee Genta from ENT who will come and see the patient.          Geoffery Lyons, MD 05/31/11 1340

## 2011-05-31 NOTE — Transfer of Care (Signed)
Immediate Anesthesia Transfer of Care Note  Patient: Ana Solis  Procedure(s) Performed: Procedure(s) (LRB): TONSILLECTOMY (N/A)  Patient Location: PACU  Anesthesia Type: General  Level of Consciousness: oriented, sedated, patient cooperative and responds to stimulation  Airway & Oxygen Therapy: Patient Spontanous Breathing and Patient connected to nasal cannula oxygen  Post-op Assessment: Report given to PACU RN, Post -op Vital signs reviewed and stable, Patient moving all extremities and Patient moving all extremities X 4  Post vital signs: Reviewed and stable  Complications: No apparent anesthesia complications

## 2011-05-31 NOTE — Preoperative (Signed)
Beta Blockers   Reason not to administer Beta Blockers:Not Applicable 

## 2011-05-31 NOTE — Brief Op Note (Signed)
05/31/2011  3:35 PM  PATIENT:  Ana Solis  21 y.o. female  PRE-OPERATIVE DIAGNOSIS:  peritonsil abscess  POST-OPERATIVE DIAGNOSIS:  peritonsillar abscess  PROCEDURE:  Procedure(s) (LRB): TONSILLECTOMY (N/A)  SURGEON:  Surgeon(s) and Role:    * Osborn Coho, MD - Primary  PHYSICIAN ASSISTANT:   ASSISTANTS: none   ANESTHESIA:   general  EBL:  Total I/O In: 1050 [I.V.:1050] Out: - min  BLOOD ADMINISTERED:none  DRAINS: none   LOCAL MEDICATIONS USED:  NONE  SPECIMEN:  Source of Specimen:  tonsils  DISPOSITION OF SPECIMEN:  PATHOLOGY  COUNTS:  YES  TOURNIQUET:  * No tourniquets in log *  DICTATION: .Other Dictation: Dictation Number [id number]  PLAN OF CARE: Admit for overnight observation  PATIENT DISPOSITION:  PACU - hemodynamically stable.   Delay start of Pharmacological VTE agent (>24hrs) due to surgical blood loss or risk of bleeding: yes

## 2011-05-31 NOTE — ED Notes (Signed)
Reports being seen here recently for same, told she has abscess in her throat, has been taking prescribed meds but no relief. Reports abscess has gotten worse and now has knot on right side of neck. Airway is intact, able to swallow but having severe pain.

## 2011-06-01 ENCOUNTER — Encounter (HOSPITAL_COMMUNITY): Payer: Self-pay | Admitting: Otolaryngology

## 2011-06-01 NOTE — Discharge Instructions (Signed)
Tonsillectomy, Adult Care After Refer to this sheet in the next few weeks. These instructions provide you with information on caring for yourself after your procedure. Your caregiver may also give you specific instructions. Your treatment has been planned according to current medical practices, but problems sometimes occur. Call your caregiver if you have any problems or questions after your procedure. HOME CARE INSTRUCTIONS   Obtain proper rest, keeping your head elevated at all times. You will feel worn out and tired for a while.   Drink plenty of fluids. This reduces pain and hastens the healing process.   Only take over-the-counter or prescription medicines for pain, discomfort, or fever as directed by your caregiver. Do not take aspirin or nonsteroidal anti-inflammatory drugs. These medications increase the possibility of bleeding.   Sometimes the use of pain medication can cause constipation. If this happens, ask your caregiver about laxatives that you can take.   When eating, only eat a small portion of your food and then take your prescribed pain medication. Eat the remainder of your food 45 minutes later. This will make swallowing less painful.   Soft and cold foods, such as gelatin, sherbet, ice cream, frozen ice pops, and cold drinks, are usually the easiest to eat. Several days after surgery, you will be able to eat more solid food.   Avoid mouth washes and gargles.   Avoid contact with people who have upper respiratory infections, such as colds and sore throats.   An ice pack applied to your neck may help with discomfort and keep swelling down.  SEEK MEDICAL CARE IF:   You have increasing pain that is not controlled with medications.   You have an oral temperature above 102 F (38.9 C).   You feel lightheaded or have a fainting spell.   You develop a rash.  SEEK IMMEDIATE MEDICAL CARE IF:   You have difficulty breathing.   You experience side effects or allergic  reactions to medications.   You bleed bright red blood from your throat, or you vomit bright red blood.  MAKE SURE YOU:  Understand these instructions.   Will watch your condition.   Will get help right away if you are not doing well or get worse.   

## 2011-06-01 NOTE — Discharge Summary (Signed)
Physician Discharge Summary  Patient ID: Ana Solis MRN: [id number] DOB/AGE: 20-03-1990 20 y.o.  Admit date: 05/31/2011 Discharge date: 06/01/2011  Admission Diagnoses:  Principal Problem:  *Tonsil, abscess   Discharge Diagnoses:  Same  Surgeries: Procedure(s): TONSILLECTOMY on 05/31/2011   Consultants: none  Discharged Condition: Improved  Hospital Course: Ana Solis is an 21 y.o. female who was admitted 05/31/2011 with a diagnosis of Tonsil, abscess and went to the operating room on 05/31/2011 and underwent the above named procedures.   Pt stable post op, d/c'ed 4/22.  Recent vital signs:  Filed Vitals:   06/01/11 0559  BP: 125/66  Pulse: 73  Temp: 97.9 F (36.6 C)  Resp: 18    Recent laboratory studies:  Results for orders placed during the hospital encounter of 05/29/11  URINALYSIS, ROUTINE W REFLEX MICROSCOPIC      Component Value Range   Color, Urine YELLOW  YELLOW    APPearance CLEAR  CLEAR    Specific Gravity, Urine 1.026  1.005 - 1.030    pH 6.5  5.0 - 8.0    Glucose, UA NEGATIVE  NEGATIVE (mg/dL)   Hgb urine dipstick LARGE (*) NEGATIVE    Bilirubin Urine NEGATIVE  NEGATIVE    Ketones, ur NEGATIVE  NEGATIVE (mg/dL)   Protein, ur NEGATIVE  NEGATIVE (mg/dL)   Urobilinogen, UA 0.2  0.0 - 1.0 (mg/dL)   Nitrite NEGATIVE  NEGATIVE    Leukocytes, UA NEGATIVE  NEGATIVE   PREGNANCY, URINE      Component Value Range   Preg Test, Ur NEGATIVE  NEGATIVE   RAPID STREP SCREEN      Component Value Range   Streptococcus, Group A Screen (Direct) NEGATIVE  NEGATIVE   POCT I-STAT, CHEM 8      Component Value Range   Sodium 141  135 - 145 (mEq/L)   Potassium 3.9  3.5 - 5.1 (mEq/L)   Chloride 103  96 - 112 (mEq/L)   BUN 9  6 - 23 (mg/dL)   Creatinine, Ser 4.09  0.50 - 1.10 (mg/dL)   Glucose, Bld 811 (*) 70 - 99 (mg/dL)   Calcium, Ion 9.14  7.82 - 1.32 (mmol/L)   TCO2 27  0 - 100 (mmol/L)   Hemoglobin 13.6  12.0 - 15.0 (g/dL)   HCT 95.6  21.3 - 08.6 (%)    URINE MICROSCOPIC-ADD ON      Component Value Range   Squamous Epithelial / LPF FEW (*) RARE    RBC / HPF 3-6  <3 (RBC/hpf)   Urine-Other MUCOUS PRESENT      Discharge Medications:   Medication List  As of 06/01/2011 10:55 AM   STOP taking these medications         amoxicillin-clavulanate 600-42.9 MG/5ML suspension         TAKE these medications         HYDROcodone-acetaminophen 7.5-500 MG/15ML solution   Commonly known as: LORTAB   Take 15 mLs by mouth every 6 (six) hours as needed. For pain.            Diagnostic Studies: Ct Soft Tissue Neck W Contrast  05/29/2011  *RADIOLOGY REPORT*  Clinical Data: Sore throat.  Recent peritonsillar abscess by CT scan.  CT NECK WITH CONTRAST  Technique:  Multidetector CT imaging of the neck was performed with intravenous contrast.  Contrast: 70mL OMNIPAQUE IOHEXOL 300 MG/ML  SOLN  Comparison: CT neck 05/05/2011.  Findings: Again seen is a rim enhancing low attenuation collection consistent with a peritonsillar  abscess on the left measuring 1.1 x 0.7 by 1.3 cm cranial-caudal cm compared to 1.6 x 0.9 by 1.7 cm cranial-caudal cm on the prior study.  Left tonsillar swelling is again seen without marked change.  There is no new abscess. Reactive lymphadenopathy on the left side of the neck is not notably changed.  The aerodigestive tract is otherwise unremarkable.  Major caliber vascular structures are patent. Imaged paranasal sinuses and mastoid air cells are clear.  Imaged intracranial contents are unremarkable.  Orbits appear normal.  IMPRESSION: Some decrease in the size of a left peritonsillar abscess as described above with persistent swelling noted.  No new abscess or other new abnormality.  Original Report Authenticated By: Bernadene Bell. Maricela Curet, M.D.   Ct Soft Tissue Neck W Contrast  05/05/2011  *RADIOLOGY REPORT*  Clinical Data: Left-sided neck pain and swelling; fever.  Question of abscess.  CT NECK WITH CONTRAST  Technique:  Multidetector CT  imaging of the neck was performed with intravenous contrast.  Contrast:  80 mL of Omnipaque 300 IV contrast  Comparison: None.  Findings: There is a focal 1.7 x 1.6 x 0.9 cm abscess noted within the periphery of the left palatine tonsil, with surrounding soft tissue inflammation involving the left parapharyngeal fat planes. There is associated enlargement of the left palatine tonsil, with partial effacement of the oropharynx and nasopharynx.  Associated soft tissue edema is noted extending inferiorly, medial to the left angle of the mandible, without definite evidence of involvement of the floor of the mouth at this time.  No additional abscesses are identified.  The adenoids are grossly unremarkable in appearance.  The hypopharynx is unremarkable; the valleculae and piriform sinuses are within normal limits. Prevertebral soft tissues are normal in thickness.  The proximal trachea is unremarkable in appearance.  There is no evidence of vascular compromise.  Scattered cervical nodes remain normal in size; there is no evidence of cervical lymphadenopathy.  The parotid and submandibular glands are unremarkable in appearance.  The thyroid gland is within normal limits.  The visualized lung apices are clear.  The visualized superior mediastinum is grossly unremarkable in appearance.  No acute osseous abnormalities are seen.  The orbits are within normal limits.  The paranasal sinuses and mastoid air cells are well-aerated.  The visualized portions of the brain are normal in appearance.  IMPRESSION: Focal 1.7 x 1.6 x 0.9 cm abscess at the periphery of the left palatine tonsil.  Surrounding soft tissue inflammation involves the left parapharyngeal fat planes; soft tissue edema extends inferiorly, medial to the left angle of the mandible, without definite involvement of the floor of the mouth at this time. Associated partial effacement of the oropharynx and nasopharynx.  Original Report Authenticated By: Tonia Ghent, M.D.     Disposition: 01-Home or Self Care  Discharge Orders    Future Orders Please Complete By Expires   Diet - low sodium heart healthy      Increase activity slowly      Discharge instructions      Comments:   1. Limited activity 2. Liquid and soft diet 3. May bathe and shower 4. Elevate Head of Bed       Follow-up Information    Follow up with Rosamaria Donn, MD in 2 weeks.   Contact information:   [address] [place] [address] [phone]           Signed: Osborn Coho 06/01/2011, 10:55 AM

## 2011-06-01 NOTE — Progress Notes (Signed)
   ENT Progress Note: POD #1 s/p Procedure(s): TONSILLECTOMY   Subjective: Decreased pain, tol po fluids  Objective: Vital signs in last 24 hours: Temp:  [97.9 F (36.6 C)-100.9 F (38.3 C)] 97.9 F (36.6 C) (04/22 0559) Pulse Rate:  [73-120] 73  (04/22 0559) Resp:  [15-32] 18  (04/22 0559) BP: (115-142)/(58-84) 125/66 mmHg (04/22 0559) SpO2:  [97 %-100 %] 100 % (04/22 0559) Weight:  [61.689 kg (136 lb)] 61.689 kg (136 lb) (04/21 1854) Weight change:     Intake/Output from previous day: 04/21 0701 - 04/22 0700 In: 2414.3 [I.V.:2414.3] Out: 850 [Urine:825; Blood:25] Intake/Output this shift:    Labs:  Basename 05/29/11 1734  WBC --  HGB 13.6  HCT 40.0  PLT --    Basename 05/29/11 1734  NA 141  K 3.9  CL 103  CO2 --  GLUCOSE 100*  BUN 9  CALCIUM --    Studies/Results: No results found.   PHYSICAL EXAM: Airway stable No bleeding   Assessment/Plan: S/p tonsillectomy for recurrent PTA. Pt stable, d/c to home.    Ana Solis 06/01/2011, 10:49 AM

## 2012-11-16 ENCOUNTER — Encounter (HOSPITAL_COMMUNITY): Payer: Self-pay | Admitting: Emergency Medicine

## 2012-11-16 ENCOUNTER — Emergency Department (HOSPITAL_COMMUNITY): Payer: 59

## 2012-11-16 ENCOUNTER — Emergency Department (HOSPITAL_COMMUNITY)
Admission: EM | Admit: 2012-11-16 | Discharge: 2012-11-16 | Disposition: A | Discharge status: Home / Self Care | Payer: 59 | Attending: Emergency Medicine | Admitting: Emergency Medicine

## 2012-11-16 DIAGNOSIS — J069 Acute upper respiratory infection, unspecified: Secondary | ICD-10-CM

## 2012-11-16 DIAGNOSIS — IMO0002 Reserved for concepts with insufficient information to code with codable children: Secondary | ICD-10-CM | POA: Insufficient documentation

## 2012-11-16 DIAGNOSIS — Z79899 Other long term (current) drug therapy: Secondary | ICD-10-CM | POA: Insufficient documentation

## 2012-11-16 DIAGNOSIS — Z792 Long term (current) use of antibiotics: Secondary | ICD-10-CM | POA: Insufficient documentation

## 2012-11-16 MED ORDER — ACETAMINOPHEN-CODEINE #3 300-30 MG PO TABS: 1.0000 | ORAL_TABLET | Freq: Every evening | ORAL | Status: DC | PRN

## 2012-11-16 MED ORDER — FLUTICASONE PROPIONATE 50 MCG/ACT NA SUSP: 2.0000 | Freq: Every day | NASAL | Status: DC

## 2012-11-16 MED ORDER — AZITHROMYCIN 250 MG PO TABS: ORAL_TABLET | ORAL | Status: DC

## 2012-11-16 NOTE — ED Provider Notes (Signed)
CSN: [id number]     Arrival date & time 11/16/12  1016 History   First MD Initiated Contact with Patient 11/16/12 1103     Chief Complaint  Patient presents with  . Cough   (Consider location/radiation/quality/duration/timing/severity/associated sxs/prior Treatment) HPI Comments: Patient is a 22 year old female with history of peritonsillar abscess who presents today with 2 weeks of worsening cough. The cough is worse at night. She reports nighttime shortness of breath due to the cough. The cough is also productive. She reports she has recently noticed some blood but she is also bringing up. She reports that the mucus she is bringing up yellow-green in color. She has had a sore throat, congestion which has resolved. She still has rhinorrhea. She has taken DayQuil, NyQuil, Alka-Seltzer cold and flu, and cough drops. These have not helped her cough. She denies fever, neck pain, headache, nausea, vomiting, abdominal pain.  The history is provided by the patient. No language interpreter was used.    Past Medical History  Diagnosis Date  . Peritonsillar abscess    Past Surgical History  Procedure Laterality Date  . Tonsillectomy  05/31/2011    Procedure: TONSILLECTOMY;  Surgeon: Osborn Coho, MD;  Location: [facility] OR;  Service: ENT;  Laterality: N/A;   History reviewed. No pertinent family history. History  Substance Use Topics  . Smoking status: Never Smoker   . Smokeless tobacco: Not on file  . Alcohol Use: No   OB History   Grav Para Term Preterm Abortions TAB SAB Ect Mult Living                 Review of Systems  Constitutional: Negative for fever and chills.  HENT: Negative for sore throat.   Respiratory: Positive for cough and shortness of breath.   Cardiovascular: Negative for chest pain.  Gastrointestinal: Negative for nausea, vomiting and abdominal pain.  All other systems reviewed and are negative.    Allergies  Clindamycin/lincomycin and Percocet  Home Medications    Current Outpatient Rx  Name  Route  Sig  Dispense  Refill  . Phenylephrine-Pheniramine-DM (THERAFLU COLD & COUGH PO)   Oral   Take 1 Package by mouth daily as needed (cold).         . Pseudoeph-Doxylamine-DM-APAP (NYQUIL PO)   Oral   Take 2 tablets by mouth daily as needed (cold).         . Pseudoephedrine-APAP-DM (DAYQUIL PO)   Oral   Take 2 tablets by mouth daily as needed (cold).         Marland Kitchen acetaminophen-codeine (TYLENOL #3) 300-30 MG per tablet   Oral   Take 1-2 tablets by mouth at bedtime as needed (cough).   15 tablet   0   . azithromycin (ZITHROMAX Z-PAK) 250 MG tablet      2 po day one, then 1 daily x 4 days   5 tablet   0   . fluticasone (FLONASE) 50 MCG/ACT nasal spray   Nasal   Place 2 sprays into the nose daily.   16 g   0    BP 124/70  Pulse 87  Temp(Src) 99.2 F (37.3 C) (Oral)  Resp 16  SpO2 98%  LMP 11/15/2012 Physical Exam  Nursing note and vitals reviewed. Constitutional: She is oriented to person, place, and time. She appears well-developed and well-nourished. No distress.  HENT:  Head: Normocephalic and atraumatic.  Right Ear: External ear normal.  Left Ear: External ear normal.  Nose: Right sinus exhibits no maxillary  sinus tenderness and no frontal sinus tenderness. Left sinus exhibits no maxillary sinus tenderness and no frontal sinus tenderness.  Mouth/Throat: Oropharynx is clear and moist.  Swollen nasal turbinates bilaterally  Eyes: Conjunctivae are normal.  Neck: Trachea normal, normal range of motion and phonation normal.  No nuchal rigidity or meningeal signs  Cardiovascular: Normal rate, regular rhythm and normal heart sounds.   Pulmonary/Chest: Effort normal and breath sounds normal. No stridor. No respiratory distress. She has no wheezes. She has no rales.  Abdominal: Soft. She exhibits no distension.  Musculoskeletal: Normal range of motion.  Neurological: She is alert and oriented to person, place, and time. She has  normal strength.  Skin: Skin is warm and dry. She is not diaphoretic. No erythema.  Psychiatric: She has a normal mood and affect. Her behavior is normal.    ED Course  Procedures (including critical care time) Labs Review Labs Reviewed - No data to display Imaging Review No results found.  MDM   1. URI (upper respiratory infection)    Patient presents with upper respiratory infection x 2 weeks. Lungs are clear to auscultation. Oxygen is 98% on RA. No wheezes, rhonchi, rales. She was given Flonase, Z-Pak, codeine. Followup with PCP. Return instructions given. Vital signs stable for discharge.    Mora Bellman, PA-C 11/16/12 1134

## 2012-11-16 NOTE — ED Notes (Signed)
Pt c/o cold x2 weeks with no relief with OTC medications.  Reports cough blood tinged sputum x1 week.

## 2012-11-17 NOTE — ED Provider Notes (Signed)
Medical screening examination/treatment/procedure(s) were performed by non-physician practitioner and as supervising physician I was immediately available for consultation/collaboration.  Lanis Storlie, MD 11/17/12 0712 

## 2013-02-27 IMAGING — CT CT NECK W/ CM
3 of 4 series · 15 of 33 positions shown, 18 images · IV contrast (80ml omni 300)
Comparison: None.

CLINICAL DATA: Left-sided neck pain and swelling; fever.  Question
of abscess.

CT NECK WITH CONTRAST
TECHNIQUE: Multidetector CT imaging of the neck was performed with
intravenous contrast.
Contrast:  80 mL of Omnipaque 300 IV contrast

[Series 2: 2cc/30ml and 1cc/45ml · axial · 0.53mm/px · z∈[-285,-115]mm · 7 of 88 slices shown, 9 images]
[im 10/88  soft-tissue]
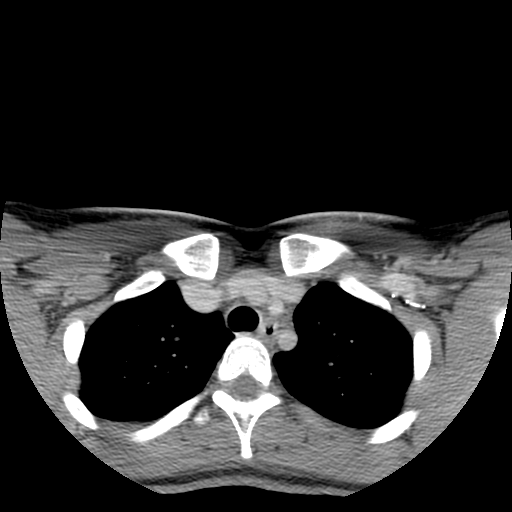
[im 10/88  bone]
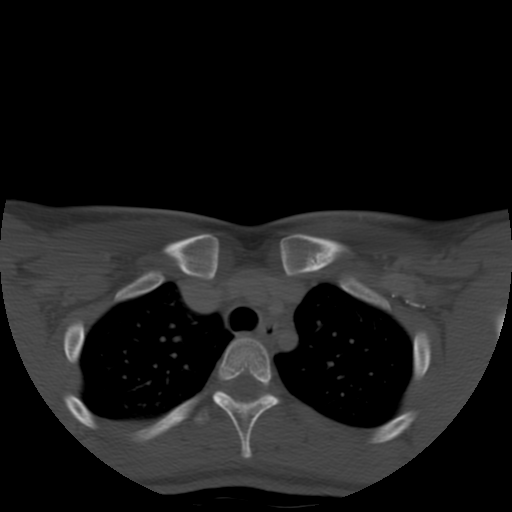
[im 20/88  bone]
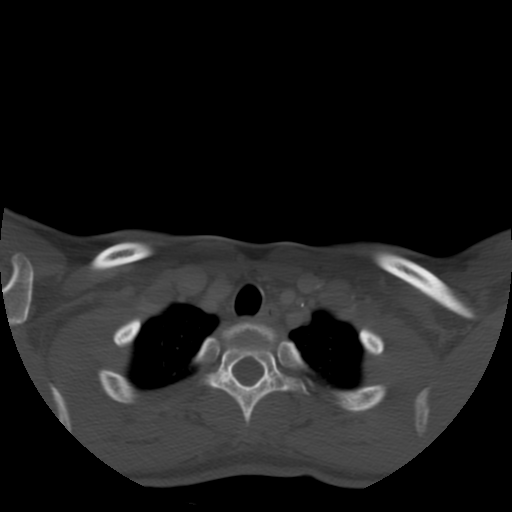
[im 30/88  bone]
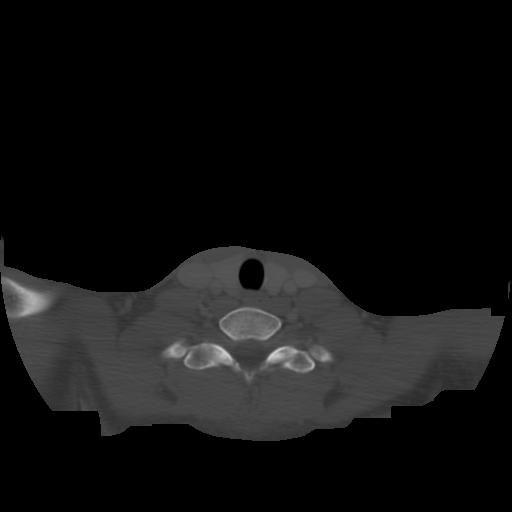
[im 49/88  bone]
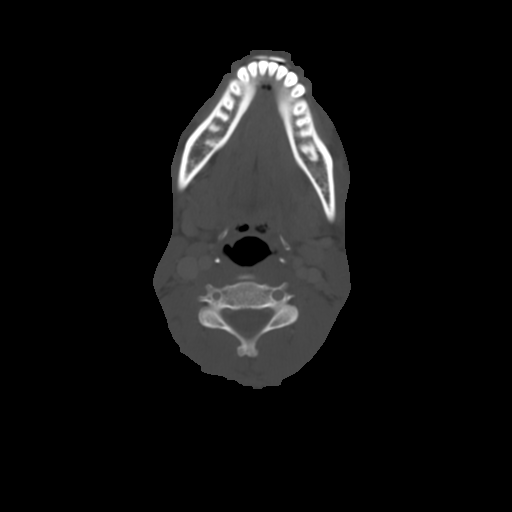
[im 59/88  soft-tissue]
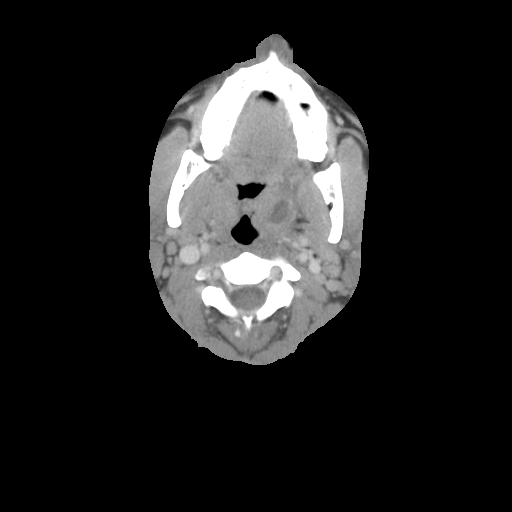
[im 59/88  bone]
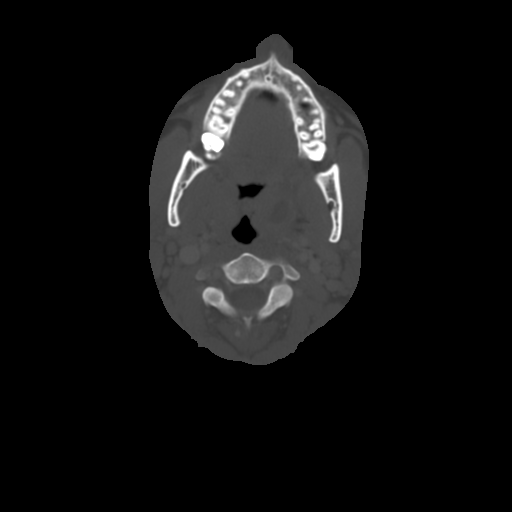
[im 68/88  bone]
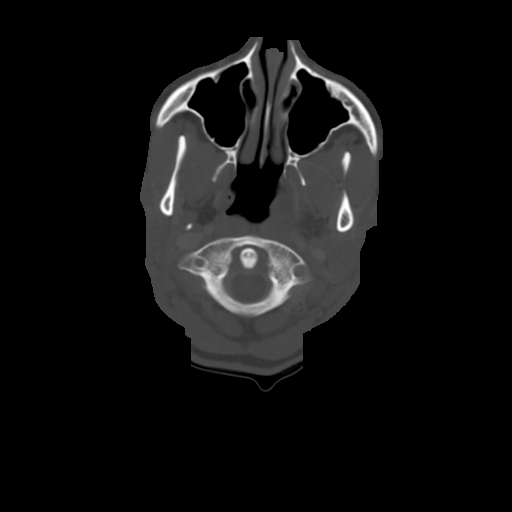
[im 78/88  bone]
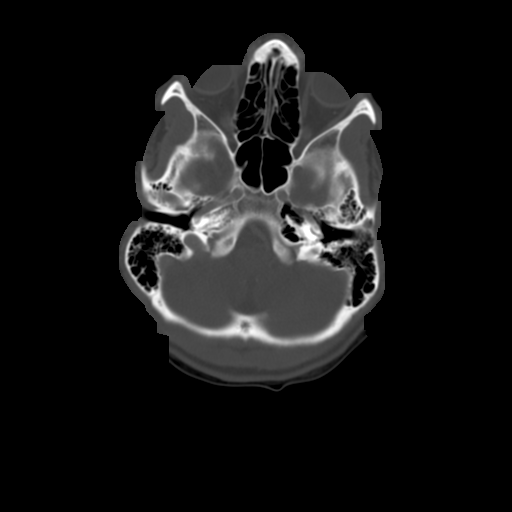

[Series 300: cor · coronal · 0.53mm/px · 3 of 111 slices shown]
[im 23/111  bone]
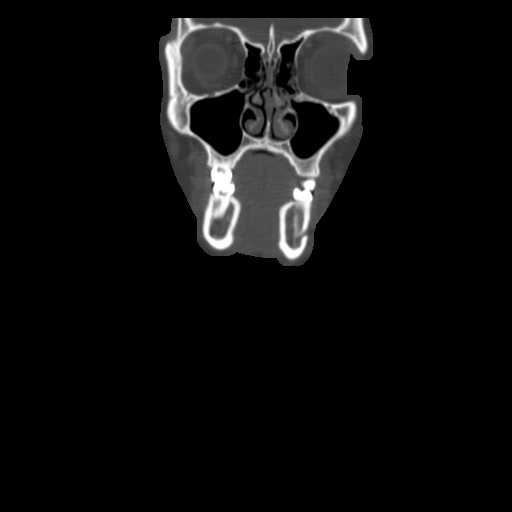
[im 45/111  bone]
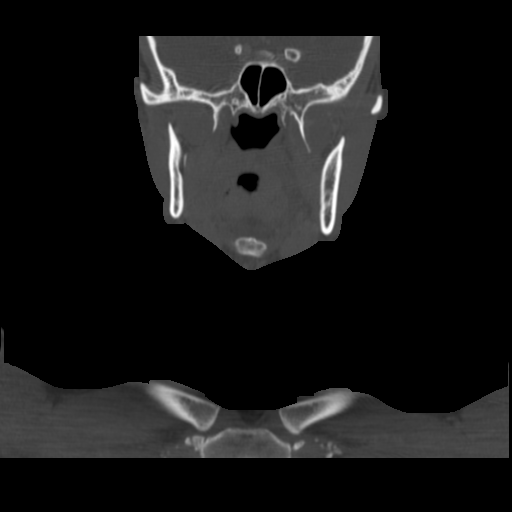
[im 67/111  bone]
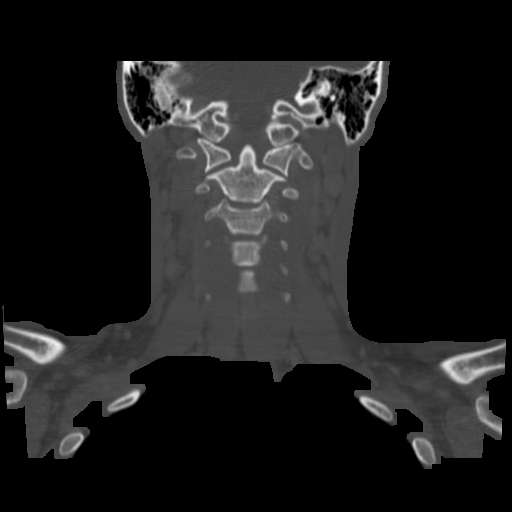

[Series 301: sag · sagittal · 0.53mm/px · 5 of 83 slices shown, 6 images]
[im 28/83  bone]
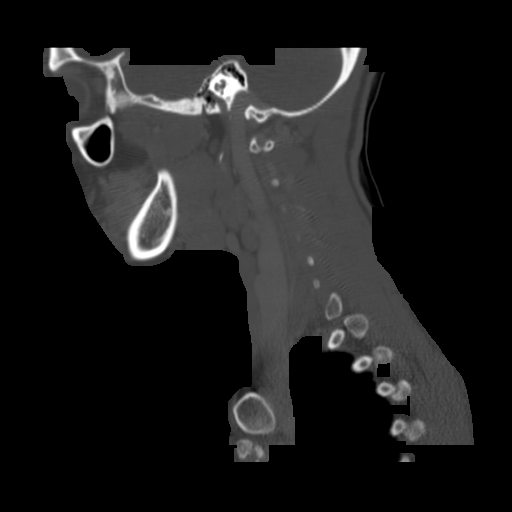
[im 35/83  bone]
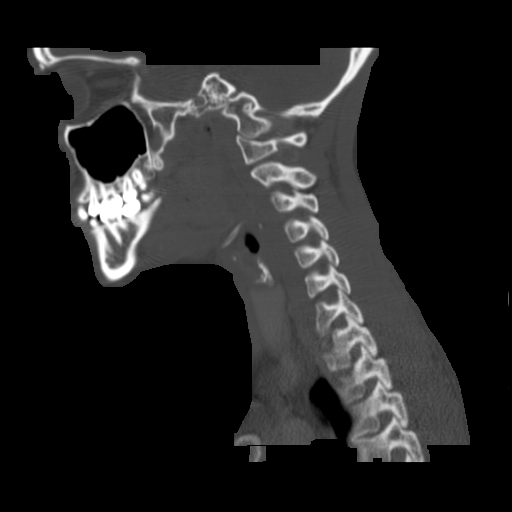
[im 42/83  soft-tissue]
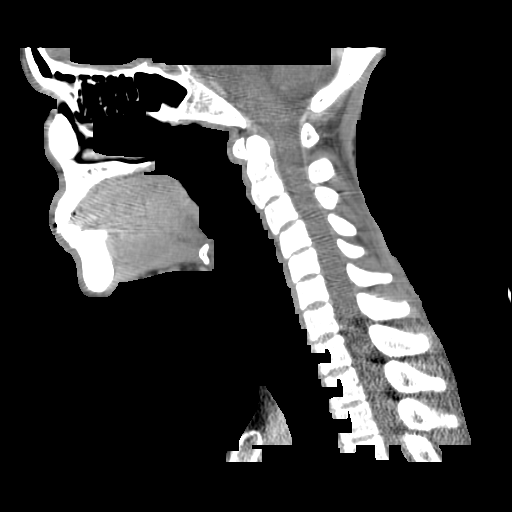
[im 42/83  bone]
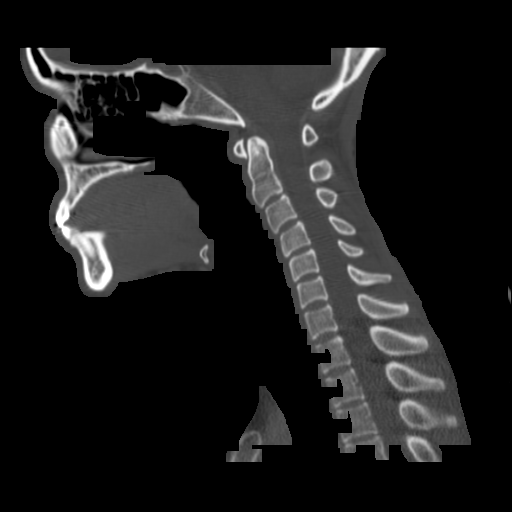
[im 48/83  bone]
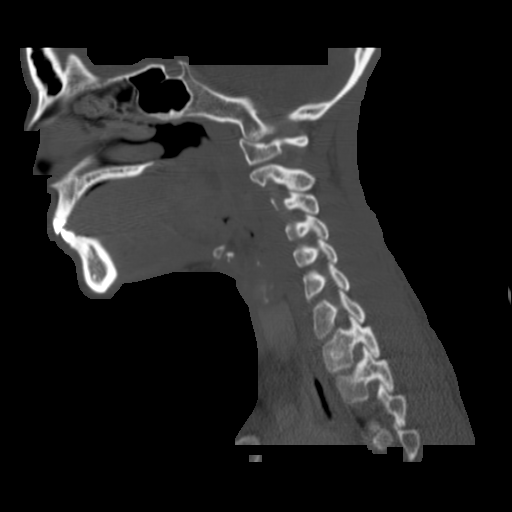
[im 55/83  bone]
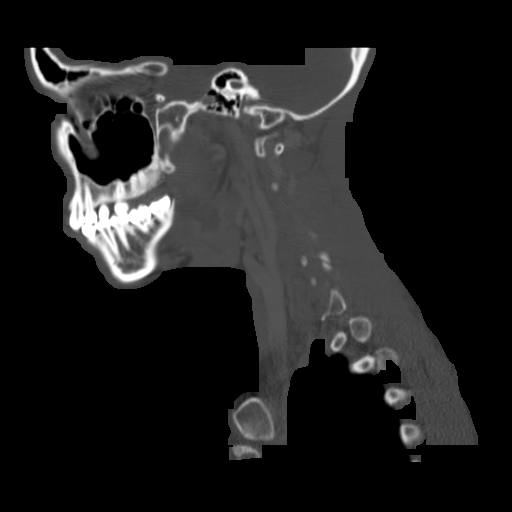

[15 of 33 positions shown; findings below may reference images not displayed]

FINDINGS: There is a focal 1.7 x 1.6 x 0.9 cm abscess noted within
the periphery of the left palatine tonsil, with surrounding soft
tissue inflammation involving the left parapharyngeal fat planes.
There is associated enlargement of the left palatine tonsil, with
partial effacement of the oropharynx and nasopharynx.

Associated soft tissue edema is noted extending inferiorly, medial
to the left angle of the mandible, without definite evidence of
involvement of the floor of the mouth at this time.

No additional abscesses are identified.  The adenoids are grossly
unremarkable in appearance.  The hypopharynx is unremarkable; the
valleculae and piriform sinuses are within normal limits.
Prevertebral soft tissues are normal in thickness.  The proximal
trachea is unremarkable in appearance.

There is no evidence of vascular compromise.  Scattered cervical
nodes remain normal in size; there is no evidence of cervical
lymphadenopathy.  The parotid and submandibular glands are
unremarkable in appearance.  The thyroid gland is within normal
limits.

The visualized lung apices are clear.  The visualized superior
mediastinum is grossly unremarkable in appearance.  No acute
osseous abnormalities are seen.

The orbits are within normal limits.  The paranasal sinuses and
mastoid air cells are well-aerated.  The visualized portions of the
brain are normal in appearance.
IMPRESSION: Focal 1.7 x 1.6 x 0.9 cm abscess at the periphery of the left
palatine tonsil.  Surrounding soft tissue inflammation involves the
left parapharyngeal fat planes; soft tissue edema extends
inferiorly, medial to the left angle of the mandible, without
definite involvement of the floor of the mouth at this time.
Associated partial effacement of the oropharynx and nasopharynx.

## 2013-03-23 IMAGING — CT CT NECK W/ CM
4 series · 16 of 33 positions shown, 19 images · IV contrast (omnipaque)
Comparison: CT neck 05/05/2011.

CLINICAL DATA: Sore throat.  Recent peritonsillar abscess by CT
scan.

CT NECK WITH CONTRAST
TECHNIQUE: Multidetector CT imaging of the neck was performed with
intravenous contrast.
Contrast: 70mL OMNIPAQUE IOHEXOL 300 MG/ML  SOLN

[Series 3: st neck 2.0 b31s · axial · 0.47mm/px · z∈[-295,-223]mm · 3 of 110 slices shown]
[im 19/110  bone]
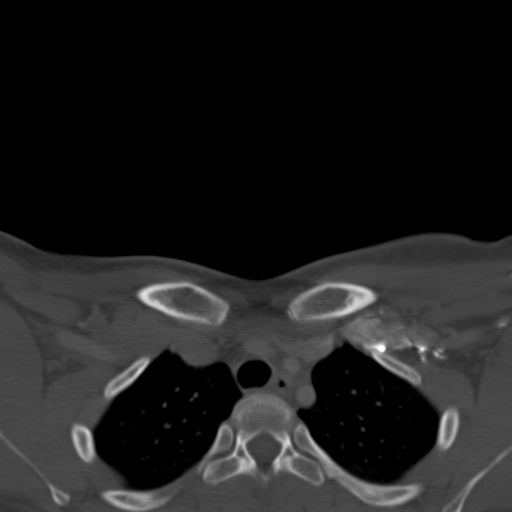
[im 37/110  bone]
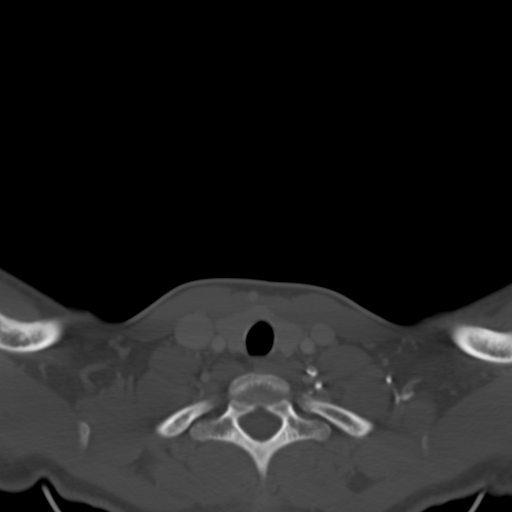
[im 55/110  bone]
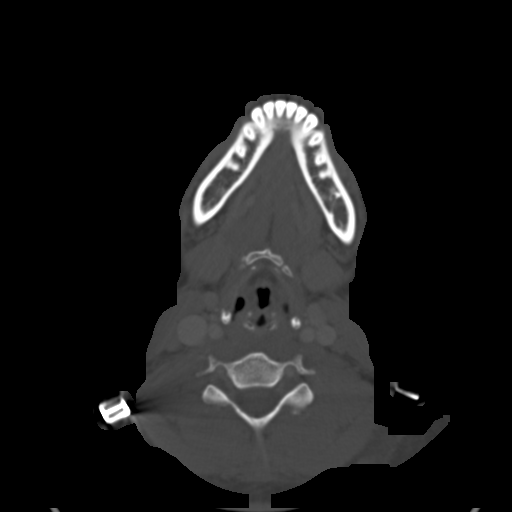

[Series 602: orthog · axial · 0.47mm/px · z∈[-340,-185]mm · 5 of 123 slices shown, 7 images]
[im 21/123  soft-tissue]
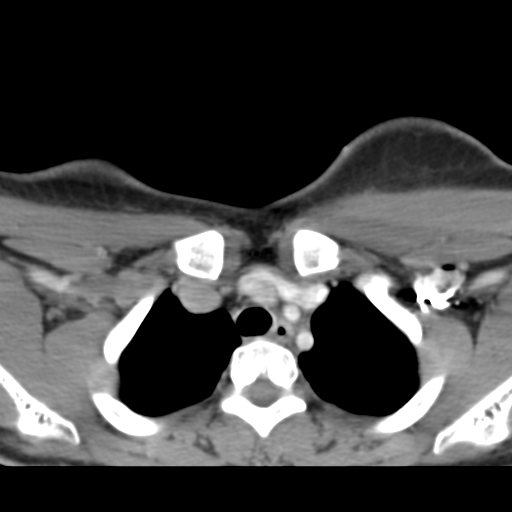
[im 21/123  bone]
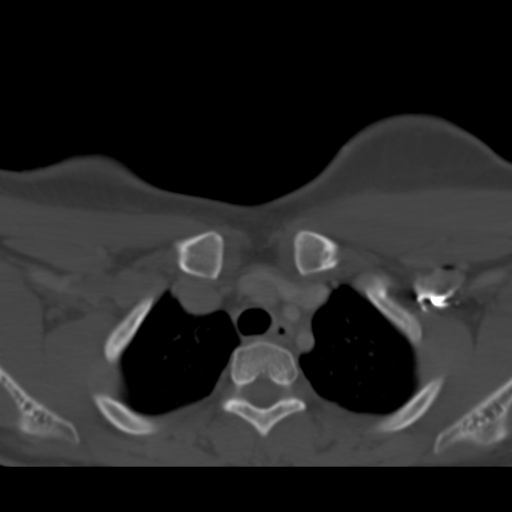
[im 41/123  bone]
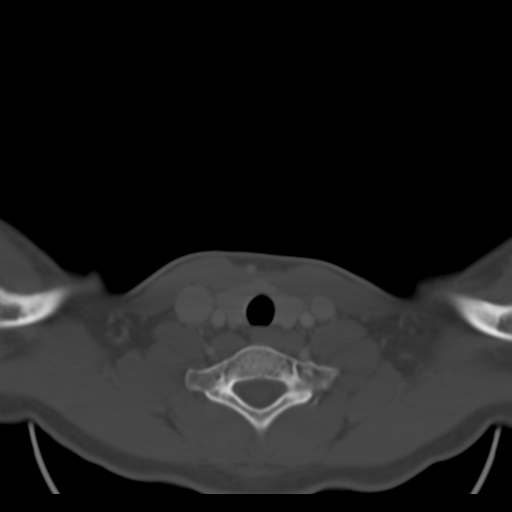
[im 62/123  bone]
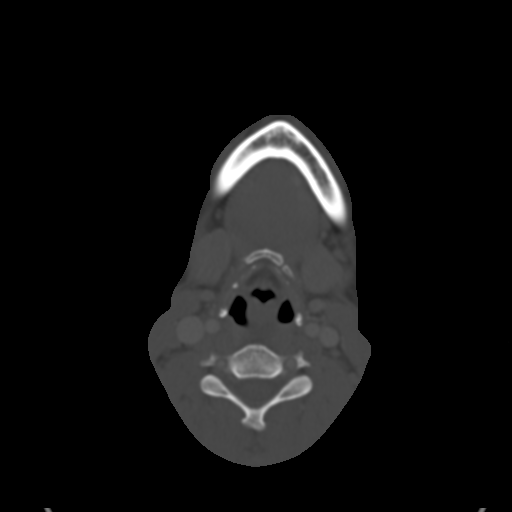
[im 82/123  bone]
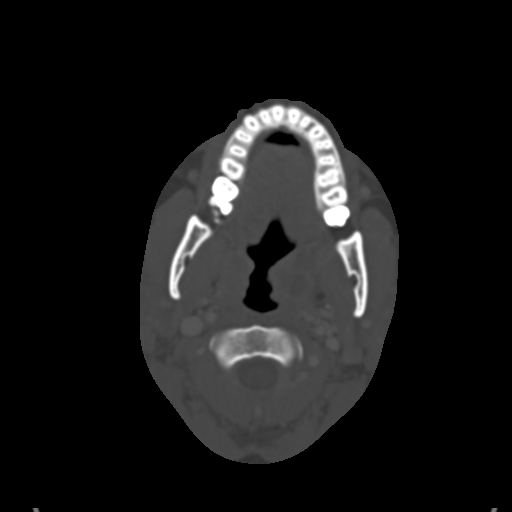
[im 102/123  soft-tissue]
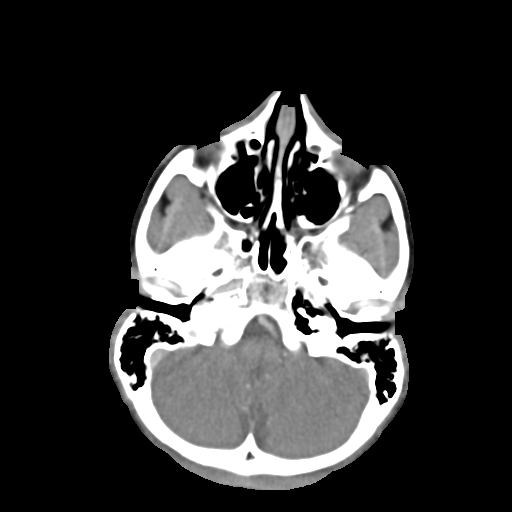
[im 102/123  bone]
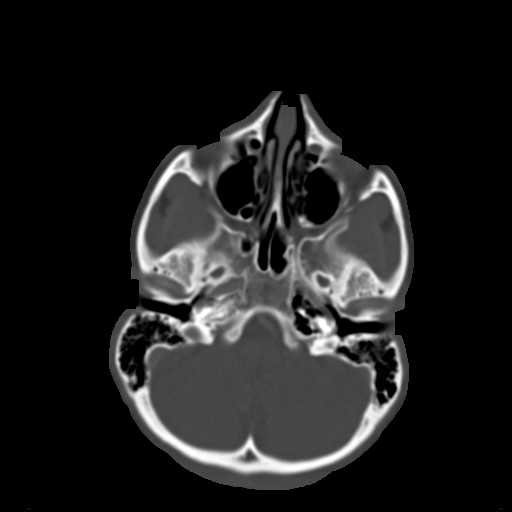

[Series 603: cor · coronal · 0.47mm/px · 3 of 105 slices shown]
[im 21/105  bone]
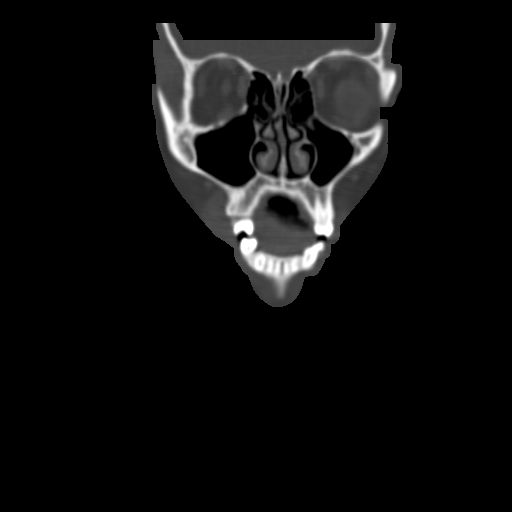
[im 42/105  bone]
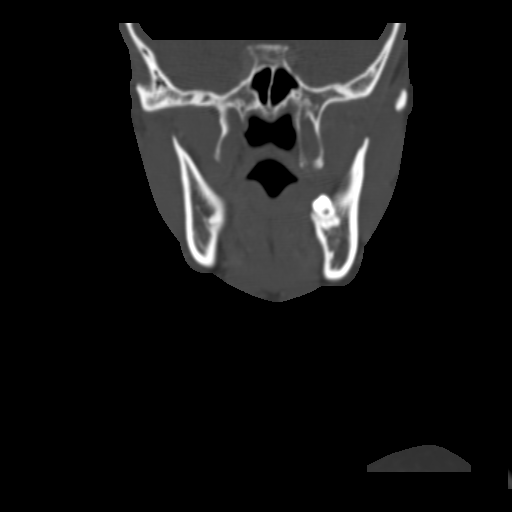
[im 63/105  bone]
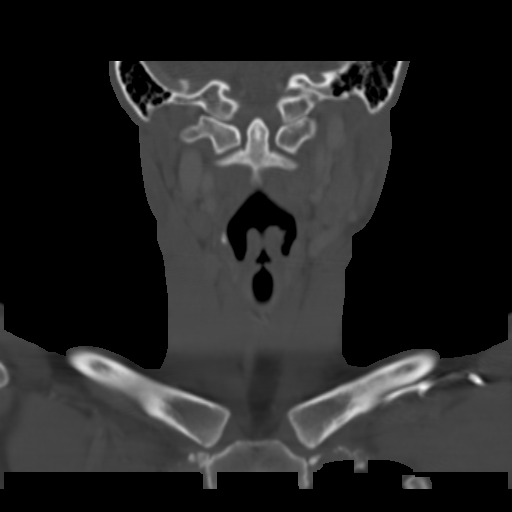

[Series 604: sag · sagittal · 0.47mm/px · 5 of 81 slices shown, 6 images]
[im 27/81  bone]
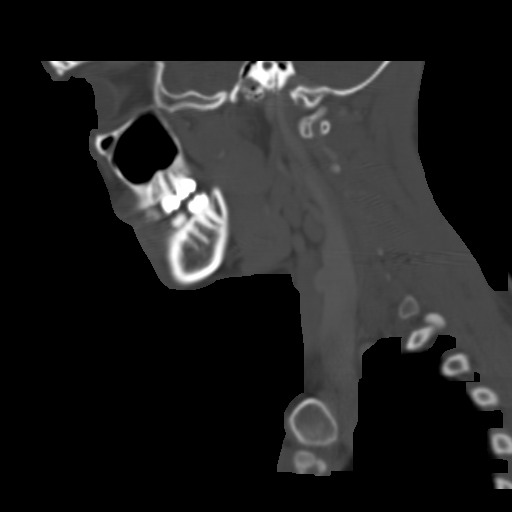
[im 34/81  bone]
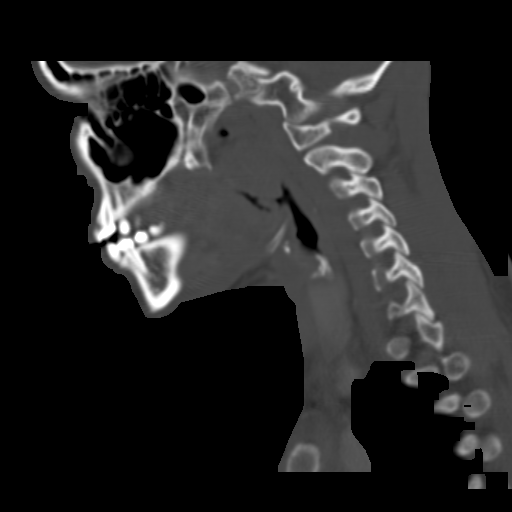
[im 41/81  soft-tissue]
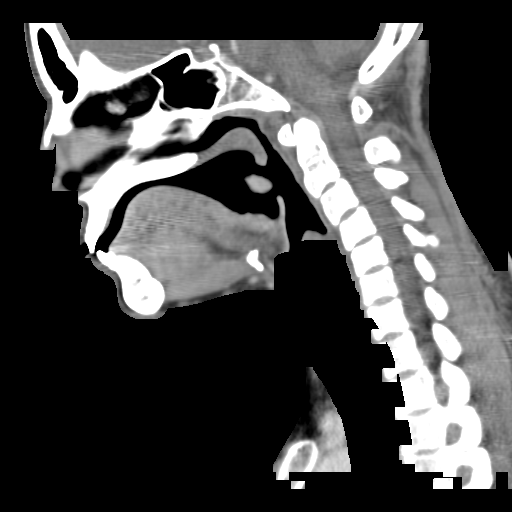
[im 41/81  bone]
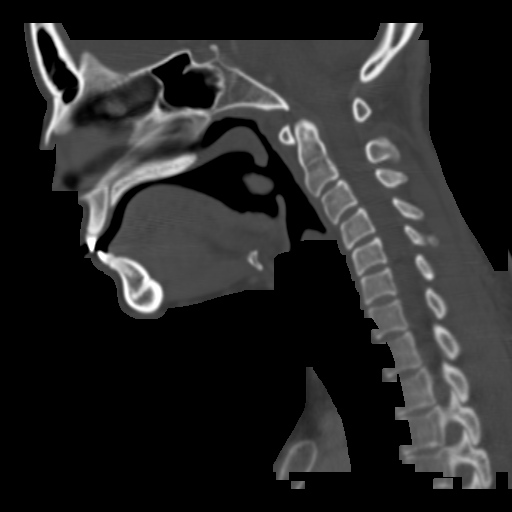
[im 47/81  bone]
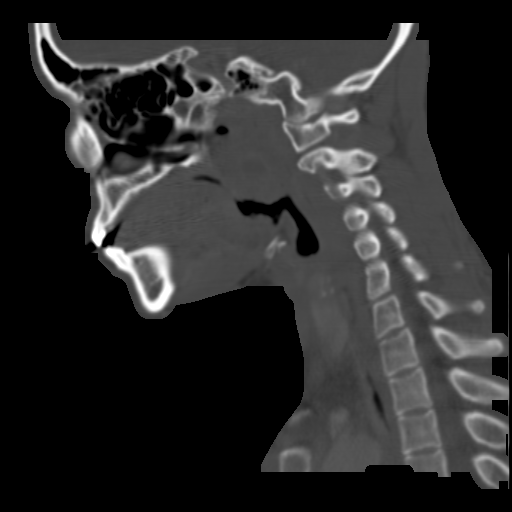
[im 54/81  bone]
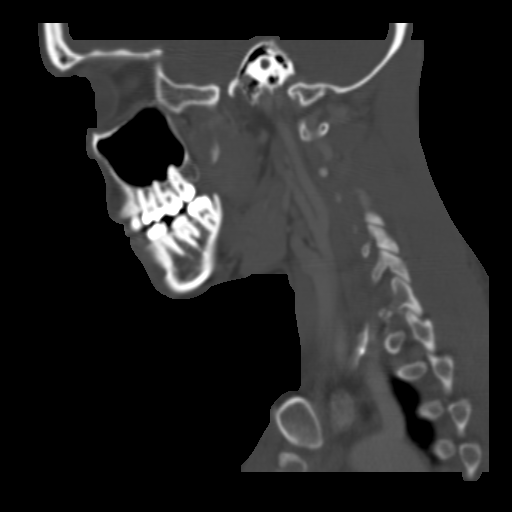

[16 of 33 positions shown; findings below may reference images not displayed]

FINDINGS: Again seen is a rim enhancing low attenuation collection
consistent with a peritonsillar abscess on the left measuring 1.1 x
0.7 by 1.3 cm cranial-caudal cm compared to 1.6 x 0.9 by 1.7 cm
cranial-caudal cm on the prior study.  Left tonsillar swelling is
again seen without marked change.  There is no new abscess.
Reactive lymphadenopathy on the left side of the neck is not
notably changed.  The aerodigestive tract is otherwise
unremarkable.  Major caliber vascular structures are patent.
Imaged paranasal sinuses and mastoid air cells are clear.  Imaged
intracranial contents are unremarkable.  Orbits appear normal.
IMPRESSION: Some decrease in the size of a left peritonsillar abscess as
described above with persistent swelling noted.  No new abscess or
other new abnormality.

## 2013-04-01 ENCOUNTER — Encounter (HOSPITAL_COMMUNITY): Payer: Self-pay | Admitting: Emergency Medicine

## 2013-04-01 ENCOUNTER — Emergency Department (HOSPITAL_COMMUNITY)
Admission: EM | Admit: 2013-04-01 | Discharge: 2013-04-01 | Disposition: A | Discharge status: Home / Self Care | Payer: 59 | Attending: Emergency Medicine | Admitting: Emergency Medicine

## 2013-04-01 DIAGNOSIS — R221 Localized swelling, mass and lump, neck: Secondary | ICD-10-CM

## 2013-04-01 DIAGNOSIS — T7840XA Allergy, unspecified, initial encounter: Secondary | ICD-10-CM

## 2013-04-01 DIAGNOSIS — IMO0002 Reserved for concepts with insufficient information to code with codable children: Secondary | ICD-10-CM | POA: Insufficient documentation

## 2013-04-01 DIAGNOSIS — M7989 Other specified soft tissue disorders: Secondary | ICD-10-CM | POA: Insufficient documentation

## 2013-04-01 DIAGNOSIS — R220 Localized swelling, mass and lump, head: Secondary | ICD-10-CM | POA: Insufficient documentation

## 2013-04-01 DIAGNOSIS — Z8709 Personal history of other diseases of the respiratory system: Secondary | ICD-10-CM | POA: Insufficient documentation

## 2013-04-01 MED ORDER — DIPHENHYDRAMINE HCL 25 MG PO CAPS
25.0000 mg | ORAL_CAPSULE | Freq: Once | ORAL | Status: AC
  Administered 2013-04-01: 25 mg via ORAL
  Filled 2013-04-01: qty 1

## 2013-04-01 MED ORDER — PREDNISONE 20 MG PO TABS
60.0000 mg | ORAL_TABLET | Freq: Once | ORAL | Status: AC
  Administered 2013-04-01: 60 mg via ORAL
  Filled 2013-04-01: qty 3

## 2013-04-01 MED ORDER — FAMOTIDINE 20 MG PO TABS
40.0000 mg | ORAL_TABLET | Freq: Once | ORAL | Status: AC
  Administered 2013-04-01: 40 mg via ORAL
  Filled 2013-04-01: qty 2

## 2013-04-01 MED ORDER — PREDNISONE 50 MG PO TABS: 50.0000 mg | ORAL_TABLET | Freq: Every day | ORAL | Status: AC

## 2013-04-01 MED ORDER — FAMOTIDINE 20 MG PO TABS: 20.0000 mg | ORAL_TABLET | Freq: Two times a day (BID) | ORAL | Status: AC

## 2013-04-01 MED ORDER — IBUPROFEN 800 MG PO TABS
800.0000 mg | ORAL_TABLET | Freq: Once | ORAL | Status: AC
  Administered 2013-04-01: 800 mg via ORAL
  Filled 2013-04-01: qty 1

## 2013-04-01 NOTE — ED Notes (Signed)
Patient presents today with a chief complaint of hives, swelling, and rash since Thursday. Patient reports she's not allergic to any foods and has not eaten anything abnormal, but attributes these symptoms to eating shrimp alfredo. Patient began experiencing hives approx. one hour after eating shrimp alfredo. Patient reports swelling to ankles, rash to extremities and trunk.

## 2013-04-01 NOTE — Discharge Instructions (Signed)
Return here as needed. Follow up with the allergist provided. Take benadryl as well.

## 2013-04-01 NOTE — ED Provider Notes (Signed)
CSN: [id number]     Arrival date & time 04/01/13  [id number] History   First MD Initiated Contact with Patient 04/01/13 1027     Chief Complaint  Patient presents with  . Allergic Reaction     (Consider location/radiation/quality/duration/timing/severity/associated sxs/prior Treatment) HPI HPI Comments: Geoffery LyonsMariah Akard is a 23 y.o. female with PMH of Peritonsillar abscess who presents to the Emergency Department complaining of allergic reaction which started 3 days ago. Patient describes swollen feet, swollen fingers, and a swollen lip. Additionally she describes feeling a tiny "knot" in her throat when she swallows. She doesn't know what caused the allergic reaction, but does describe eating shrimp 1 hour beforehand. She has taken Benadryl OTC for relief of her symptoms, but states that they are getting worse, although she says that the hives went away. Patient denies nausea, vomiting, diarrhea, chest pain, SOB, dyspnea.    Past Medical History  Diagnosis Date  . Peritonsillar abscess    Past Surgical History  Procedure Laterality Date  . Tonsillectomy  05/31/2011    Procedure: TONSILLECTOMY;  Surgeon: Osborn Cohoavid Shoemaker, MD;  [facility];  Service: ENT;  Laterality: N/A;   No family history on file. History  Substance Use Topics  . Smoking status: Never Smoker   . Smokeless tobacco: Not on file  . Alcohol Use: No   OB History   Grav Para Term Preterm Abortions TAB SAB Ect Mult Living                 Review of Systems All other systems negative except as documented in the HPI. All pertinent positives and negatives as reviewed in the HPI.    Allergies  Clindamycin/lincomycin; Percocet; Shellfish allergy; and Spinach  Home Medications   Current Outpatient Rx  Name  Route  Sig  Dispense  Refill  . acetaminophen-codeine (TYLENOL #3) 300-30 MG per tablet   Oral   Take 1-2 tablets by mouth at bedtime as needed (cough).   15 tablet   0   . azithromycin (ZITHROMAX Z-PAK) 250  MG tablet      2 po day one, then 1 daily x 4 days   5 tablet   0   . fluticasone (FLONASE) 50 MCG/ACT nasal spray   Nasal   Place 2 sprays into the nose daily.   16 g   0   . Phenylephrine-Pheniramine-DM (THERAFLU COLD & COUGH PO)   Oral   Take 1 Package by mouth daily as needed (cold).         . Pseudoeph-Doxylamine-DM-APAP (NYQUIL PO)   Oral   Take 2 tablets by mouth daily as needed (cold).         . Pseudoephedrine-APAP-DM (DAYQUIL PO)   Oral   Take 2 tablets by mouth daily as needed (cold).          BP 131/81  Pulse 69  Temp(Src) 98 F (36.7 C) (Oral)  Resp 17  SpO2 99%  LMP 03/08/2013 Physical Exam  Nursing note and vitals reviewed. Constitutional: She is oriented to person, place, and time. She appears well-developed and well-nourished. No distress.  HENT:  Head: Normocephalic and atraumatic.  Mouth/Throat: Oropharynx is clear and moist. No trismus in the jaw. No uvula swelling. No oropharyngeal exudate, posterior oropharyngeal edema or posterior oropharyngeal erythema.  Neck: Normal range of motion. Neck supple.  Cardiovascular: Normal rate, regular rhythm and normal heart sounds.  Exam reveals no gallop and no friction rub.   No murmur heard. Pulmonary/Chest: Effort normal and  breath sounds normal. No stridor. No respiratory distress. She has no wheezes. She has no rales.  Neurological: She is alert and oriented to person, place, and time.  Skin: Skin is warm and dry. She is not diaphoretic.       ED Course  Procedures (including critical care time)   Patient be treated for an allergic reaction based on her history of present illness and physical exam. the patient does not have any difficulty breathing.  The patient is advised to return here as needed.  I have referred her to an allergy and asthma clinic.  Carlyle Dolly, PA-C 04/02/13 1511  Carlyle Dolly, PA-C 04/02/13 [phone]

## 2013-04-02 NOTE — ED Provider Notes (Signed)
Medical screening examination/treatment/procedure(s) were performed by non-physician practitioner and as supervising physician I was immediately available for consultation/collaboration.  EKG Interpretation   None       Aarya Quebedeaux, MD, FACEP   Reuel Lamadrid L Kyria Bumgardner, MD 04/02/13 1610
